# Patient Record
Sex: Female | Born: 1975 | Race: Black or African American | Hispanic: No | Marital: Single | Smoking: Never smoker
Health system: Southern US, Community
[De-identification: ages and names within clinical notes are randomized; demographics above are authoritative.]

## PROBLEM LIST (undated history)

## (undated) DIAGNOSIS — F419 Anxiety disorder, unspecified: Secondary | ICD-10-CM

## (undated) DIAGNOSIS — I10 Essential (primary) hypertension: Secondary | ICD-10-CM

---

## 2014-01-09 ENCOUNTER — Ambulatory Visit: Payer: Self-pay | Admitting: Gynecology

## 2014-01-20 ENCOUNTER — Ambulatory Visit (INDEPENDENT_AMBULATORY_CARE_PROVIDER_SITE_OTHER): Payer: BC Managed Care – PPO | Admitting: Gynecology

## 2014-01-20 ENCOUNTER — Other Ambulatory Visit (HOSPITAL_COMMUNITY)
Admission: RE | Admit: 2014-01-20 | Discharge: 2014-01-20 | Disposition: A | Discharge status: Home / Self Care | Payer: BC Managed Care – PPO | Source: Ambulatory Visit | Attending: Gynecology | Admitting: Gynecology

## 2014-01-20 ENCOUNTER — Encounter: Payer: Self-pay | Admitting: Gynecology

## 2014-01-20 VITALS — BP 112/72 | Ht 64.75 in | Wt 187.0 lb

## 2014-01-20 DIAGNOSIS — Z01419 Encounter for gynecological examination (general) (routine) without abnormal findings: Secondary | ICD-10-CM | POA: Diagnosis not present

## 2014-01-20 DIAGNOSIS — Z30018 Encounter for initial prescription of other contraceptives: Secondary | ICD-10-CM

## 2014-01-20 DIAGNOSIS — Z1151 Encounter for screening for human papillomavirus (HPV): Secondary | ICD-10-CM | POA: Diagnosis present

## 2014-01-20 DIAGNOSIS — R635 Abnormal weight gain: Secondary | ICD-10-CM

## 2014-01-20 DIAGNOSIS — Z3009 Encounter for other general counseling and advice on contraception: Secondary | ICD-10-CM

## 2014-01-20 LAB — CBC WITH DIFFERENTIAL/PLATELET
BASOS ABS: 0 10*3/uL (ref 0.0–0.1)
BASOS PCT: 0 % (ref 0–1)
EOS ABS: 0.1 10*3/uL (ref 0.0–0.7)
EOS PCT: 1 % (ref 0–5)
HCT: 39.2 % (ref 36.0–46.0)
Hemoglobin: 13.2 g/dL (ref 12.0–15.0)
LYMPHS PCT: 37 % (ref 12–46)
Lymphs Abs: 2.2 10*3/uL (ref 0.7–4.0)
MCH: 29.5 pg (ref 26.0–34.0)
MCHC: 33.7 g/dL (ref 30.0–36.0)
MCV: 87.5 fL (ref 78.0–100.0)
MONO ABS: 0.5 10*3/uL (ref 0.1–1.0)
Monocytes Relative: 9 % (ref 3–12)
Neutro Abs: 3.2 10*3/uL (ref 1.7–7.7)
Neutrophils Relative %: 53 % (ref 43–77)
PLATELETS: 291 10*3/uL (ref 150–400)
RBC: 4.48 MIL/uL (ref 3.87–5.11)
RDW: 14.2 % (ref 11.5–15.5)
WBC: 6 10*3/uL (ref 4.0–10.5)

## 2014-01-20 LAB — LIPID PANEL
CHOL/HDL RATIO: 2.9 ratio
Cholesterol: 148 mg/dL (ref 0–200)
HDL: 51 mg/dL (ref >39)
LDL Cholesterol: 90 mg/dL (ref 0–99)
Triglycerides: 35 mg/dL (ref <150)
VLDL: 7 mg/dL (ref 0–40)

## 2014-01-20 LAB — COMPREHENSIVE METABOLIC PANEL
ALK PHOS: 61 U/L (ref 39–117)
ALT: 9 U/L (ref 0–35)
AST: 11 U/L (ref 0–37)
Albumin: 4.5 g/dL (ref 3.5–5.2)
BUN: 13 mg/dL (ref 6–23)
CALCIUM: 8.9 mg/dL (ref 8.4–10.5)
CHLORIDE: 103 meq/L (ref 96–112)
CO2: 25 mEq/L (ref 19–32)
CREATININE: 0.77 mg/dL (ref 0.50–1.10)
Glucose, Bld: 96 mg/dL (ref 70–99)
Potassium: 4.3 mEq/L (ref 3.5–5.3)
Sodium: 135 mEq/L (ref 135–145)
Total Bilirubin: 0.3 mg/dL (ref 0.2–1.2)
Total Protein: 6.6 g/dL (ref 6.0–8.3)

## 2014-01-20 LAB — TSH: TSH: 2.111 u[IU]/mL (ref 0.350–4.500)

## 2014-01-20 NOTE — Patient Instructions (Signed)
Levonorgestrel intrauterine device (IUD) What is this medicine? LEVONORGESTREL IUD (LEE voe nor jes trel) is a contraceptive (birth control) device. The device is placed inside the uterus by a healthcare professional. It is used to prevent pregnancy and can also be used to treat heavy bleeding that occurs during your period. Depending on the device, it can be used for 3 to 5 years. This medicine may be used for other purposes; ask your health care provider or pharmacist if you have questions. COMMON BRAND NAME(S): LILETTA, Mirena, Skyla What should I tell my health care provider before I take this medicine? They need to know if you have any of these conditions: -abnormal Pap smear -cancer of the breast, uterus, or cervix -diabetes -endometritis -genital or pelvic infection now or in the past -have more than one sexual partner or your partner has more than one partner -heart disease -history of an ectopic or tubal pregnancy -immune system problems -IUD in place -liver disease or tumor -problems with blood clots or take blood-thinners -use intravenous drugs -uterus of unusual shape -vaginal bleeding that has not been explained -an unusual or allergic reaction to levonorgestrel, other hormones, silicone, or polyethylene, medicines, foods, dyes, or preservatives -pregnant or trying to get pregnant -breast-feeding How should I use this medicine? This device is placed inside the uterus by a health care professional. Talk to your pediatrician regarding the use of this medicine in children. Special care may be needed. Overdosage: If you think you have taken too much of this medicine contact a poison control center or emergency room at once. NOTE: This medicine is only for you. Do not share this medicine with others. What if I miss a dose? This does not apply. What may interact with this medicine? Do not take this medicine with any of the following  medications: -amprenavir -bosentan -fosamprenavir This medicine may also interact with the following medications: -aprepitant -barbiturate medicines for inducing sleep or treating seizures -bexarotene -griseofulvin -medicines to treat seizures like carbamazepine, ethotoin, felbamate, oxcarbazepine, phenytoin, topiramate -modafinil -pioglitazone -rifabutin -rifampin -rifapentine -some medicines to treat HIV infection like atazanavir, indinavir, lopinavir, nelfinavir, tipranavir, ritonavir -St. John's wort -warfarin This list may not describe all possible interactions. Give your health care provider a list of all the medicines, herbs, non-prescription drugs, or dietary supplements you use. Also tell them if you smoke, drink alcohol, or use illegal drugs. Some items may interact with your medicine. What should I watch for while using this medicine? Visit your doctor or health care professional for regular check ups. See your doctor if you or your partner has sexual contact with others, becomes HIV positive, or gets a sexual transmitted disease. This product does not protect you against HIV infection (AIDS) or other sexually transmitted diseases. You can check the placement of the IUD yourself by reaching up to the top of your vagina with clean fingers to feel the threads. Do not pull on the threads. It is a good habit to check placement after each menstrual period. Call your doctor right away if you feel more of the IUD than just the threads or if you cannot feel the threads at all. The IUD may come out by itself. You may become pregnant if the device comes out. If you notice that the IUD has come out use a backup birth control method like condoms and call your health care provider. Using tampons will not change the position of the IUD and are okay to use during your period. What side effects may   I notice from receiving this medicine? Side effects that you should report to your doctor or  health care professional as soon as possible: -allergic reactions like skin rash, itching or hives, swelling of the face, lips, or tongue -fever, flu-like symptoms -genital sores -high blood pressure -no menstrual period for 6 weeks during use -pain, swelling, warmth in the leg -pelvic pain or tenderness -severe or sudden headache -signs of pregnancy -stomach cramping -sudden shortness of breath -trouble with balance, talking, or walking -unusual vaginal bleeding, discharge -yellowing of the eyes or skin Side effects that usually do not require medical attention (report to your doctor or health care professional if they continue or are bothersome): -acne -breast pain -change in sex drive or performance -changes in weight -cramping, dizziness, or faintness while the device is being inserted -headache -irregular menstrual bleeding within first 3 to 6 months of use -nausea This list may not describe all possible side effects. Call your doctor for medical advice about side effects. You may report side effects to FDA at 1-800-FDA-1088. Where should I keep my medicine? This does not apply. NOTE: This sheet is a summary. It may not cover all possible information. If you have questions about this medicine, talk to your doctor, pharmacist, or health care provider.  2015, Elsevier/Gold Standard. (2011-06-22 13:54:04)  

## 2014-01-20 NOTE — Progress Notes (Signed)
Ashley Steele 1975/08/17 [id number]   History:    38 y.o.  for annual gyn exam who is a new patient to the practice. Patient has moved from [place], [place].. Patient reports no previous history of any abnormal Pap smear. She reports a normal Pap smear in 2014. She is a gravida 2 para 1 AB 1 (one elective abortion). She's currently using condoms for contraception. She reports normal 5 day menstrual cycles. In the past she had used oral contraceptive pills but compliance became an issue and would like to discuss other forms of contraception. Patient denies any past history of any STDs.  Past medical history,surgical history, family history and social history were all reviewed and documented in the EPIC chart.  Gynecologic History Patient's last menstrual period was 01/02/2014. Contraception: condoms Last Pap: 2014. Results were: normal Last mammogram: Not indicated. Results were: Not indicated  Obstetric History OB History  Gravida Para Term Preterm AB SAB TAB Ectopic Multiple Living  2 1   1  1   1     # Outcome Date GA Lbr Len/2nd Weight Sex Delivery Anes PTL Lv  2 TAB           1 PAR                ROS: A ROS was performed and pertinent positives and negatives are included in the history.  GENERAL: No fevers or chills. HEENT: No change in vision, no earache, sore throat or sinus congestion. NECK: No pain or stiffness. CARDIOVASCULAR: No chest pain or pressure. No palpitations. PULMONARY: No shortness of breath, cough or wheeze. GASTROINTESTINAL: No abdominal pain, nausea, vomiting or diarrhea, melena or bright red blood per rectum. GENITOURINARY: No urinary frequency, urgency, hesitancy or dysuria. MUSCULOSKELETAL: No joint or muscle pain, no back pain, no recent trauma. DERMATOLOGIC: No rash, no itching, no lesions. ENDOCRINE: No polyuria, polydipsia, no heat or cold intolerance. No recent change in weight. HEMATOLOGICAL: No anemia or easy bruising or bleeding. NEUROLOGIC: No headache,  seizures, numbness, tingling or weakness. PSYCHIATRIC: No depression, no loss of interest in normal activity or change in sleep pattern.     Exam: chaperone present  BP 112/72  Ht 5' 4.75" (1.645 m)  Wt 187 lb (84.823 kg)  BMI 31.35 kg/m2  LMP 01/02/2014  Body mass index is 31.35 kg/(m^2).  General appearance : Well developed well nourished female. No acute distress HEENT: Neck supple, trachea midline, no carotid bruits, no thyroidmegaly Lungs: Clear to auscultation, no rhonchi or wheezes, or rib retractions  Heart: Regular rate and rhythm, no murmurs or gallops Breast:Examined in sitting and supine position were symmetrical in appearance, no palpable masses or tenderness,  no skin retraction, no nipple inversion, no nipple discharge, no skin discoloration, no axillary or supraclavicular lymphadenopathy Abdomen: no palpable masses or tenderness, no rebound or guarding Extremities: no edema or skin discoloration or tenderness  Pelvic:  Bartholin, Urethra, Skene Glands: Within normal limits             Vagina: No gross lesions or discharge  Cervix: No gross lesions or discharge  Uterus  anteverted, normal size, shape and consistency, non-tender and mobile  Adnexa  Without masses or tenderness  Anus and perineum  normal   Rectovaginal  normal sphincter tone without palpated masses or tenderness             Hemoccult not indicated     Assessment/Plan:  38 y.o. female for annual exam who is new to the practice.  Since we do not have her previous Pap smear we will do a Pap smear today didn't adhere to the new guidelines. The following labs were ordered today: CBC, comprehensive metabolic panel, fasting lipid profile, TSH, and urinalysis. Literature information on the Mirena IUD was provided as well as on the [place] IUD. Patient will decide and make an appointment accordingly. We discussed her for some monthly self breast examinations.  Note: This dictation was prepared with   Dragon/digital dictation along withSmart phrase technology. Any transcriptional errors that result from this process are unintentional.   Terrance Mass MD, 8:58 AM 01/20/2014

## 2014-01-20 NOTE — Addendum Note (Signed)
Addended by: Alen Blew on: 01/20/2014 04:28 PM   Modules accepted: Orders

## 2014-01-21 LAB — URINALYSIS W MICROSCOPIC + REFLEX CULTURE
BACTERIA UA: NONE SEEN
Bilirubin Urine: NEGATIVE
CASTS: NONE SEEN
Crystals: NONE SEEN
Glucose, UA: NEGATIVE mg/dL
HGB URINE DIPSTICK: NEGATIVE
KETONES UR: NEGATIVE mg/dL
Leukocytes, UA: NEGATIVE
NITRITE: NEGATIVE
PH: 7 (ref 5.0–8.0)
PROTEIN: NEGATIVE mg/dL
Specific Gravity, Urine: 1.021 (ref 1.005–1.030)
Squamous Epithelial / LPF: NONE SEEN
Urobilinogen, UA: 1 mg/dL (ref 0.0–1.0)

## 2014-01-22 ENCOUNTER — Telehealth: Payer: Self-pay | Admitting: Gynecology

## 2014-01-22 LAB — CYTOLOGY - PAP

## 2014-01-22 NOTE — Telephone Encounter (Signed)
01/22/14-Pt was advised today that her [facility] insurance covers either the Mirena or Paraguard IUD at 100% since she is using it for contraception. Pt to advise if she wishes to proceed.wl

## 2014-01-23 ENCOUNTER — Telehealth: Payer: Self-pay | Admitting: *Deleted

## 2014-01-23 MED ORDER — NORETHINDRONE ACET-ETHINYL EST 1-20 MG-MCG PO TABS: 1.0000 | ORAL_TABLET | Freq: Every day | ORAL | Status: DC

## 2014-01-23 NOTE — Telephone Encounter (Signed)
Please call in prescription for Junel 1/20. 1 PKG with 11 refills. Have her start it the second day she is bleeding on her cycle.

## 2014-01-23 NOTE — Telephone Encounter (Signed)
Pt informed with the below note, rx sent. 

## 2014-01-23 NOTE — Telephone Encounter (Signed)
Pt calling to follow up from [place] on 01/20/14 she does not want to proceed with Mirena or paraguard she would like Rx for BCP. Please advise

## 2014-03-30 ENCOUNTER — Encounter: Payer: BC Managed Care – PPO | Admitting: Gynecology

## 2014-04-07 ENCOUNTER — Encounter: Payer: Self-pay | Admitting: Gynecology

## 2014-07-30 ENCOUNTER — Ambulatory Visit: Payer: BLUE CROSS/BLUE SHIELD | Attending: Family Medicine

## 2014-07-30 DIAGNOSIS — M5440 Lumbago with sciatica, unspecified side: Secondary | ICD-10-CM | POA: Diagnosis present

## 2014-07-30 DIAGNOSIS — R29898 Other symptoms and signs involving the musculoskeletal system: Secondary | ICD-10-CM | POA: Insufficient documentation

## 2014-07-30 DIAGNOSIS — M25659 Stiffness of unspecified hip, not elsewhere classified: Secondary | ICD-10-CM | POA: Diagnosis not present

## 2014-07-30 DIAGNOSIS — M5442 Lumbago with sciatica, left side: Secondary | ICD-10-CM

## 2014-07-30 DIAGNOSIS — M5441 Lumbago with sciatica, right side: Secondary | ICD-10-CM

## 2014-07-30 NOTE — Patient Instructions (Signed)
Trunk: Rotation   Lie on back on firm, flat surface with knees bent. Keep head and shoulders flat, slowly lower knees to floor. May also do with legs straight. Lift one at a time up and across to touch floor. Hold ___20_ seconds. Repeat __3__ times, alternating sides. Do _3___ sessions per day. CAUTION: Movement should be gentle, steady and slow.  Copyright  VHI. All rights reserved.  Knee to Chest   Lying supine, bend involved knee to chest. Hold 20 seconds and perform _3__ times. Repeat with other leg. Do __3_ times per day.  Copyright  VHI. All rights reserved.  Double Knee to Chest (Flexion)   Gently pull both knees toward chest. Feel stretch in lower back or buttock area. Breathing deeply, Hold _20___ seconds. Repeat _3___ times. Do _3___ sessions per day.  http://gt2.exer.us/228   Copyright  VHI. All rights reserved.

## 2014-07-30 NOTE — Therapy (Addendum)
[facility] [address] [place], [place], [address] Phone: [phone]   Fax:  [phone]  Physical Therapy Evaluation  Patient Details  Name: Ashley Steele MRN: [id number] Date of Birth: 1975/11/13 Referring Provider:  Lujean Amel, MD  Encounter Date: 07/30/2014      PT End of Session - 07/30/14 1053    Visit Number 1   Date for PT Re-Evaluation 09/17/14   PT Start Time 1017   PT Stop Time 1110   PT Time Calculation (min) 53 min   Activity Tolerance Patient tolerated treatment well   Behavior During Therapy [facility] for tasks assessed/performed      History reviewed. No pertinent past medical history.  History reviewed. No pertinent past surgical history.  There were no vitals taken for this visit.  Visit Diagnosis:  Bilateral low back pain with sciatica, sciatica laterality unspecified - Plan: PT plan of care cert/re-cert  Weakness of right hip - Plan: PT plan of care cert/re-cert  Stiffness of hip joint, unspecified laterality - Plan: PT plan of care cert/re-cert      Subjective Assessment - 07/30/14 1021    Symptoms Pt presents to PT with complaints of LBP that is now radiating into the LEs (Rt>Lt) with progressive LE.    Pertinent History No incident or injury   Limitations Sitting;House hold activities   How long can you sit comfortably? 1 hour max   Diagnostic tests none   Patient Stated Goals Reduce pain   Currently in Pain? Yes   Pain Score 8    Pain Location Back   Pain Orientation Right;Left;Lower   Pain Descriptors / Indicators Stabbing;Burning;Shooting   Pain Type Chronic pain   Pain Radiating Towards both legs- shooting to the ankle   Pain Onset Other (comment)  6 month history   Aggravating Factors  sitting, housework   Pain Relieving Factors medication, not sitting   Multiple Pain Sites No          [facility] PT Assessment - 07/30/14 0001    Assessment   Medical Diagnosis LPB  radiating to Rt LE (M54.5)   Onset Date 01/03/14   Precautions   Precautions None   Balance Screen   Has the patient fallen in the past 6 months No   Has the patient had a decrease in activity level because of a fear of falling?  No   Is the patient reluctant to leave their home because of a fear of falling?  No   Home Environment   Living Enviornment Private residence   Home Access Stairs to enter  2nd story apartment   Prior Function   Level of Independence Independent with basic ADLs   Vocation Full time employment   Tourist information centre manager: sitting, desk   Leisure Pt walks for exercise and not performing since 01/2014   Cognition   Overall Cognitive Status Within Functional Limits for tasks assessed   Observation/Other Assessments   Focus on Therapeutic Outcomes (FOTO)  53% limitation   Posture/Postural Control   Posture/Postural Control No significant limitations   ROM / Strength   AROM / PROM / Strength AROM;PROM;Strength   AROM   Overall AROM Comments Lumbar AROM is full with no pain reported by pt   AROM Assessment Site Lumbar;Hip   PROM   Overall PROM Comments Bilateral hip flexibility limited by 25-50% in all directions without pain   PROM Assessment Site Hip   Right/Left Hip Right;Left   Left Hip Flexion --  Bilateral hamstrings limited by 56-60%   Strength   Overall Strength Deficits   Strength Assessment Site Hip;Knee   Right/Left Hip Right;Left   Right Hip Flexion 4-/5   Left Hip Flexion 4/5   Right/Left Knee Right;Left   Right Knee Flexion 4+/5   Right Knee Extension 4+/5   Left Knee Flexion 4+/5   Left Knee Extension 4+/5   Palpation   Palpation Pt with muscle tension noted over bilateral lumbar paraspinals wiht increased tone.  Reduced PA mobility with mobs in the lumbar spine without pain.  Tender to palpation over Rt SI joint and deep gluteals   Special Tests    Special Tests Lumbar   Slump test   Findings Negative   Side  Right   Straight Leg Raise   Findings Negative   Side  Right   Ambulation/Gait   Ambulation/Gait Yes   Ambulation/Gait Assistance 7: Independent                  [facility] Adult PT Treatment/Exercise - 07/30/14 0001    Exercises   Exercises Lumbar;Knee/Hip   Lumbar Exercises: Stretches   Single Knee to Chest Stretch 3 reps;20 seconds   Double Knee to Chest Stretch 3 reps;20 seconds   Lower Trunk Rotation 3 reps;20 seconds   Modalities   Modalities Electrical Stimulation;Moist Heat   Moist Heat Therapy   Number Minutes Moist Heat 15 Minutes   Moist Heat Location Other (comment)  lumbar   Electrical Stimulation   Electrical Stimulation Location lumbar   Electrical Stimulation Action interferential   Electrical Stimulation Parameters 15 min   Electrical Stimulation Goals Pain                PT Education - 07/30/14 1053    Education provided Yes   Education Details HEP   Person(s) Educated Patient   Methods Explanation;Demonstration;Tactile cues;Handout   Comprehension Verbalized understanding;Returned demonstration          PT Short Term Goals - 07/30/14 1058    PT SHORT TERM GOAL #1   Title be independent in initial HEP   Time 4   Period Weeks   PT SHORT TERM GOAL #2   Title report < or = to 6/10 LBP with sitting at work   Time 4   Period Weeks   PT SHORT TERM GOAL #3   Title initiate return to regular walking program and verbalize how to safely progress   Time 4   Period Weeks   Status New           PT Long Term Goals - 07/30/14 1016    PT LONG TERM GOAL #1   Title be independent in advanced HEP   Time 8   Period Weeks   Status New   PT LONG TERM GOAL #2   Title reduce FOTO to < or = to 38% limitation   Time 8   Period Weeks   Status New   PT LONG TERM GOAL #3   Title return to regular walking routine without limitation   Time 8   Period Weeks   Status New   PT LONG TERM GOAL #4   Title sit at work with < or = to 4/10 LBP    Time 8   Period Weeks   Status New   PT LONG TERM GOAL #5   Title report a 70% reduction in LE radiuclopathy   Time 8   Period Weeks   Status New  Additional Long Term Goals   Additional Long Term Goals Yes   PT LONG TERM GOAL #6   Title demonstrate 5/5 Rt hip flexor strength to improve safety and endurance   Time 8   Period Weeks   Status New               Plan - 07/30/14 1053    Clinical Impression Statement Pt is a 39 y.o. female with 6 month history LBP, Rt>Lt LE weakness and radiculopathy.  Limitation in hip and lumbar flexibility and pain with sitting.   Pt will benefit from skilled therapeutic intervention in order to improve on the following deficits Decreased range of motion;Impaired flexibility;Improper body mechanics;Pain;Decreased strength         Problem List There are no active problems to display for this patient.   Kinney Sackmann, PT 07/30/2014, 11:09 AM PHYSICAL THERAPY DISCHARGE SUMMARY  Visits from Start of Care: 1 Current functional level related to goals / functional outcomes: Pt attended 1 PT session for evaluation and didn't return.  Pt wanted to try medication and requested D/C from PT.     Remaining deficits: See above evaluation.    Education / Equipment: HEP Plan: Patient agrees to discharge.  Patient goals were not met. Patient is being discharged due to the patient's request.  ?????    Sigurd Sos, PT 09/02/2014 9:07 AM   [facility] [address] [place], [place], [address] Phone: [phone]   Fax:  [phone]

## 2014-08-06 ENCOUNTER — Other Ambulatory Visit: Payer: Self-pay | Admitting: Family Medicine

## 2014-08-06 DIAGNOSIS — M545 Low back pain: Secondary | ICD-10-CM

## 2014-08-06 DIAGNOSIS — M79604 Pain in right leg: Secondary | ICD-10-CM

## 2014-08-11 ENCOUNTER — Telehealth: Payer: Self-pay

## 2014-08-11 ENCOUNTER — Ambulatory Visit
Admission: RE | Admit: 2014-08-11 | Discharge: 2014-08-11 | Disposition: A | Discharge status: Home / Self Care | Payer: BLUE CROSS/BLUE SHIELD | Source: Ambulatory Visit | Attending: Family Medicine | Admitting: Family Medicine

## 2014-08-11 DIAGNOSIS — M545 Low back pain: Secondary | ICD-10-CM

## 2014-08-11 DIAGNOSIS — M79604 Pain in right leg: Secondary | ICD-10-CM

## 2014-08-11 NOTE — Telephone Encounter (Signed)
Pt cancel appt, she having a MRI done and wants to know the results before continuing rehab.

## 2014-08-12 ENCOUNTER — Ambulatory Visit: Payer: BLUE CROSS/BLUE SHIELD | Admitting: Physical Therapy

## 2014-08-14 ENCOUNTER — Ambulatory Visit: Payer: BLUE CROSS/BLUE SHIELD | Attending: Family Medicine | Admitting: Physical Therapy

## 2014-08-14 ENCOUNTER — Telehealth: Payer: Self-pay | Admitting: Physical Therapy

## 2014-08-14 DIAGNOSIS — R29898 Other symptoms and signs involving the musculoskeletal system: Secondary | ICD-10-CM | POA: Insufficient documentation

## 2014-08-14 DIAGNOSIS — M5440 Lumbago with sciatica, unspecified side: Secondary | ICD-10-CM | POA: Insufficient documentation

## 2014-08-14 DIAGNOSIS — M25659 Stiffness of unspecified hip, not elsewhere classified: Secondary | ICD-10-CM | POA: Insufficient documentation

## 2014-08-19 ENCOUNTER — Ambulatory Visit: Payer: BLUE CROSS/BLUE SHIELD | Admitting: Physical Therapy

## 2014-08-21 ENCOUNTER — Encounter: Payer: BLUE CROSS/BLUE SHIELD | Admitting: Physical Therapy

## 2014-08-24 ENCOUNTER — Encounter: Payer: BLUE CROSS/BLUE SHIELD | Admitting: Physical Therapy

## 2014-08-26 ENCOUNTER — Encounter: Payer: BLUE CROSS/BLUE SHIELD | Admitting: Physical Therapy

## 2014-09-02 ENCOUNTER — Ambulatory Visit: Payer: BLUE CROSS/BLUE SHIELD | Admitting: Physical Therapy

## 2014-09-04 ENCOUNTER — Ambulatory Visit: Payer: BLUE CROSS/BLUE SHIELD | Admitting: Physical Therapy

## 2014-09-09 ENCOUNTER — Encounter: Payer: BLUE CROSS/BLUE SHIELD | Admitting: Physical Therapy

## 2014-09-11 ENCOUNTER — Encounter: Payer: BLUE CROSS/BLUE SHIELD | Admitting: Physical Therapy

## 2014-12-21 ENCOUNTER — Other Ambulatory Visit: Payer: Self-pay

## 2014-12-21 MED ORDER — NORETHINDRONE ACET-ETHINYL EST 1-20 MG-MCG PO TABS: 1.0000 | ORAL_TABLET | Freq: Every day | ORAL | Status: DC

## 2014-12-21 NOTE — Telephone Encounter (Signed)
Patient has Ce scheduled 01/22/15 but Oc's run out this coming weekend. Needs a refill until visit. Rx sent.

## 2014-12-29 ENCOUNTER — Other Ambulatory Visit: Payer: Self-pay | Admitting: Gynecology

## 2015-01-22 ENCOUNTER — Ambulatory Visit (INDEPENDENT_AMBULATORY_CARE_PROVIDER_SITE_OTHER): Payer: BLUE CROSS/BLUE SHIELD | Admitting: Gynecology

## 2015-01-22 ENCOUNTER — Encounter: Payer: Self-pay | Admitting: Gynecology

## 2015-01-22 VITALS — BP 122/80 | Ht 64.0 in | Wt 208.0 lb

## 2015-01-22 DIAGNOSIS — N760 Acute vaginitis: Secondary | ICD-10-CM | POA: Diagnosis not present

## 2015-01-22 DIAGNOSIS — Z01419 Encounter for gynecological examination (general) (routine) without abnormal findings: Secondary | ICD-10-CM

## 2015-01-22 DIAGNOSIS — N898 Other specified noninflammatory disorders of vagina: Secondary | ICD-10-CM | POA: Diagnosis not present

## 2015-01-22 DIAGNOSIS — B9689 Other specified bacterial agents as the cause of diseases classified elsewhere: Secondary | ICD-10-CM

## 2015-01-22 DIAGNOSIS — A499 Bacterial infection, unspecified: Secondary | ICD-10-CM | POA: Diagnosis not present

## 2015-01-22 LAB — WET PREP FOR TRICH, YEAST, CLUE
Clue Cells Wet Prep HPF POC: NONE SEEN
Trich, Wet Prep: NONE SEEN
Yeast Wet Prep HPF POC: NONE SEEN

## 2015-01-22 MED ORDER — NORETHINDRONE ACET-ETHINYL EST 1-20 MG-MCG PO TABS: 1.0000 | ORAL_TABLET | Freq: Every day | ORAL | Status: DC

## 2015-01-22 MED ORDER — TINIDAZOLE 500 MG PO TABS: ORAL_TABLET | ORAL | Status: DC

## 2015-01-22 NOTE — Patient Instructions (Signed)
Tinidazole tablets What is this medicine? TINIDAZOLE (tye NI da zole) is an antiinfective. It is used to treat amebiasis, giardiasis, trichomoniasis, and vaginosis. It will not work for colds, flu, or other viral infections. This medicine may be used for other purposes; ask your health care provider or pharmacist if you have questions. COMMON BRAND NAME(S): Tindamax What should I tell my health care provider before I take this medicine? They need to know if you have any of these conditions: -anemia or other blood disorders -if you frequently drink alcohol containing drinks -receiving hemodialysis -seizure disorder -an unusual or allergic reaction to tinidazole, other medicines, foods, dyes, or preservatives -pregnant or trying to get pregnant -breast-feeding How should I use this medicine? Take this medicine by mouth with a full glass of water. Follow the directions on the prescription label. Take with food. Take your medicine at regular intervals. Do not take your medicine more often than directed. Take all of your medicine as directed even if you think you are better. Do not skip doses or stop your medicine early. Talk to your pediatrician regarding the use of this medicine in children. While this drug may be prescribed for children as young as 3 years of age for selected conditions, precautions do apply. Overdosage: If you think you have taken too much of this medicine contact a poison control center or emergency room at once. NOTE: This medicine is only for you. Do not share this medicine with others. What if I miss a dose? If you miss a dose, take it as soon as you can. If it is almost time for your next dose, take only that dose. Do not take double or extra doses. What may interact with this medicine? Do not take this medicine with any of the following medications: -alcohol or any product that contains alcohol -amprenavir oral solution -disulfiram -paclitaxel injection -ritonavir  oral solution -sertraline oral solution -sulfamethoxazole-trimethoprim injection This medicine may also interact with the following medications: -cholestyramine -cimetidine -conivaptan -cyclosporin -fluorouracil -fosphenytoin, phenytoin -ketoconazole -lithium -phenobarbital -tacrolimus -warfarin This list may not describe all possible interactions. Give your health care provider a list of all the medicines, herbs, non-prescription drugs, or dietary supplements you use. Also tell them if you smoke, drink alcohol, or use illegal drugs. Some items may interact with your medicine. What should I watch for while using this medicine? Tell your doctor or health care professional if your symptoms do not improve or if they get worse. Avoid alcoholic drinks while you are taking this medicine and for three days afterward. Alcohol may make you feel dizzy, sick, or flushed. If you are being treated for a sexually transmitted disease, avoid sexual contact until you have finished your treatment. Your sexual partner may also need treatment. What side effects may I notice from receiving this medicine? Side effects that you should report to your doctor or health care professional as soon as possible: -allergic reactions like skin rash, itching or hives, swelling of the face, lips, or tongue -breathing problems -confusion, depression -dark or white patches in the mouth -feeling faint or lightheaded, falls -fever, infection -numbness, tingling, pain or weakness in the hands or feet -pain when passing urine -seizures -unusually weak or tired -vaginal irritation or discharge -vomiting Side effects that usually do not require medical attention (report to your doctor or health care professional if they continue or are bothersome): -dark brown or reddish urine -diarrhea -headache -loss of appetite -metallic taste -nausea -stomach upset This list may not describe all  possible side effects. Call your  doctor for medical advice about side effects. You may report side effects to FDA at 1-800-FDA-1088. Where should I keep my medicine? Keep out of the reach of children. Store at room temperature between 15 and 30 degrees C (59 and 86 degrees F). Protect from light and moisture. Keep container tightly closed. Throw away any unused medicine after the expiration date. NOTE: This sheet is a summary. It may not cover all possible information. If you have questions about this medicine, talk to your doctor, pharmacist, or health care provider.  2015, Elsevier/Gold Standard. (2008-02-17 15:22:28) Bacterial Vaginosis Bacterial vaginosis is a vaginal infection that occurs when the normal balance of bacteria in the vagina is disrupted. It results from an overgrowth of certain bacteria. This is the most common vaginal infection in women of childbearing age. Treatment is important to prevent complications, especially in pregnant women, as it can cause a premature delivery. CAUSES  Bacterial vaginosis is caused by an increase in harmful bacteria that are normally present in smaller amounts in the vagina. Several different kinds of bacteria can cause bacterial vaginosis. However, the reason that the condition develops is not fully understood. RISK FACTORS Certain activities or behaviors can put you at an increased risk of developing bacterial vaginosis, including:  Having a new sex partner or multiple sex partners.  Douching.  Using an intrauterine device (IUD) for contraception. Women do not get bacterial vaginosis from toilet seats, bedding, swimming pools, or contact with objects around them. SIGNS AND SYMPTOMS  Some women with bacterial vaginosis have no signs or symptoms. Common symptoms include:  Grey vaginal discharge.  A fishlike odor with discharge, especially after sexual intercourse.  Itching or burning of the vagina and vulva.  Burning or pain with urination. DIAGNOSIS  Your health care  provider will take a medical history and examine the vagina for signs of bacterial vaginosis. A sample of vaginal fluid may be taken. Your health care provider will look at this sample under a microscope to check for bacteria and abnormal cells. A vaginal pH test may also be done.  TREATMENT  Bacterial vaginosis may be treated with antibiotic medicines. These may be given in the form of a pill or a vaginal cream. A second round of antibiotics may be prescribed if the condition comes back after treatment.  HOME CARE INSTRUCTIONS   Only take over-the-counter or prescription medicines as directed by your health care provider.  If antibiotic medicine was prescribed, take it as directed. Make sure you finish it even if you start to feel better.  Do not have sex until treatment is completed.  Tell all sexual partners that you have a vaginal infection. They should see their health care provider and be treated if they have problems, such as a mild rash or itching.  Practice safe sex by using condoms and only having one sex partner. SEEK MEDICAL CARE IF:   Your symptoms are not improving after 3 days of treatment.  You have increased discharge or pain.  You have a fever. MAKE SURE YOU:   Understand these instructions.  Will watch your condition.  Will get help right away if you are not doing well or get worse. FOR MORE INFORMATION  Centers for Disease Control and Prevention, Division of STD Prevention: [employer]raiser[employer]raud[employer] American Sexual Health Association (ASHA): www.ashastd.org  Document Released: 05/22/2005 Document Revised: 03/12/2013 Document Reviewed: 01/01/2013 [facility] Patient Information 2015 [place], [place]. This information is not intended to replace advice given to you  by your health care provider. Make sure you discuss any questions you have with your health care provider.

## 2015-01-22 NOTE — Progress Notes (Signed)
Ashley Steele [birth year] [id number]   History:    39 y.o.  for annual gyn exam with a complaint of a slight vaginal discharge for a few days. She is sexually active. She is in a monogamous relationship. She is doing well on her oral contraception pills having regular light menstrual cycles. Patient's Tdap vaccine is up-to-date. Patient currently being evaluated and treated by orthopedics for back and lower extremity discomfort and pain. Patient denies any past history of any STDs. No prior history of any abnormal Pap smears.  Past medical history,surgical history, family history and social history were all reviewed and documented in the EPIC chart.  Gynecologic History Patient's last menstrual period was 12/29/2014. Contraception: OCP (estrogen/progesterone) Last Pap: 2015. Results were: normal Last mammogram: Not indicated. Results were: Not indicated  Obstetric History OB History  Gravida Para Term Preterm AB SAB TAB Ectopic Multiple Living  2 1   1  1   1     # Outcome Date GA Lbr Len/2nd Weight Sex Delivery Anes PTL Lv  2 TAB           1 Para                ROS: A ROS was performed and pertinent positives and negatives are included in the history.  GENERAL: No fevers or chills. HEENT: No change in vision, no earache, sore throat or sinus congestion. NECK: No pain or stiffness. CARDIOVASCULAR: No chest pain or pressure. No palpitations. PULMONARY: No shortness of breath, cough or wheeze. GASTROINTESTINAL: No abdominal pain, nausea, vomiting or diarrhea, melena or bright red blood per rectum. GENITOURINARY: No urinary frequency, urgency, hesitancy or dysuria. MUSCULOSKELETAL: No joint or muscle pain, no back pain, no recent trauma. DERMATOLOGIC: No rash, no itching, no lesions. ENDOCRINE: No polyuria, polydipsia, no heat or cold intolerance. No recent change in weight. HEMATOLOGICAL: No anemia or easy bruising or bleeding. NEUROLOGIC: No headache, seizures, numbness, tingling or  weakness. PSYCHIATRIC: No depression, no loss of interest in normal activity or change in sleep pattern.     Exam: chaperone present  BP 122/80 mmHg  Ht 5\' 4"  (1.626 m)  Wt 208 lb (94.348 kg)  BMI 35.69 kg/m2  LMP 12/29/2014  Body mass index is 35.69 kg/(m^2).  General appearance : Well developed well nourished female. No acute distress HEENT: Eyes: no retinal hemorrhage or exudates,  Neck supple, trachea midline, no carotid bruits, no thyroidmegaly Lungs: Clear to auscultation, no rhonchi or wheezes, or rib retractions  Heart: Regular rate and rhythm, no murmurs or gallops Breast:Examined in sitting and supine position were symmetrical in appearance, no palpable masses or tenderness,  no skin retraction, no nipple inversion, no nipple discharge, no skin discoloration, no axillary or supraclavicular lymphadenopathy Abdomen: no palpable masses or tenderness, no rebound or guarding Extremities: no edema or skin discoloration or tenderness  Pelvic:  Bartholin, Urethra, Skene Glands: Within normal limits             Vagina: No gross lesions or discharge  Cervix: No gross lesions or discharge  Uterus  anteverted, normal size, shape and consistency, non-tender and mobile  Adnexa  Without masses or tenderness  Anus and perineum  normal   Rectovaginal  normal sphincter tone without palpated masses or tenderness             Hemoccult not indicated   Wet prep moderate WBC, moderate bacteria  Assessment/Plan:  39 y.o. female for annual exam with what appears to be a mild  form of bacterial vaginosis she'll be treated with Tindamax 500 mg 4 tablets today repeat in 24 hours. Pap smear not indicated according to the new guidelines at this time. Prescription refill for oral contraception pill was provided. Patient to schedule appointment next week for her screening blood work since she is not fasting today which will include the following: Fasting lipid profile, comprehensive metabolic panel, TSH,  CBC, and urinalysis.   Terrance Mass MD, 4:38 PM 01/22/2015

## 2015-01-23 LAB — URINALYSIS W MICROSCOPIC + REFLEX CULTURE
BILIRUBIN URINE: NEGATIVE
Bacteria, UA: NONE SEEN [HPF]
Casts: NONE SEEN [LPF]
Crystals: NONE SEEN [HPF]
Glucose, UA: NEGATIVE
Hgb urine dipstick: NEGATIVE
KETONES UR: NEGATIVE
LEUKOCYTES UA: NEGATIVE
NITRITE: NEGATIVE
PH: 7.5 (ref 5.0–8.0)
Protein, ur: NEGATIVE
RBC / HPF: NONE SEEN RBC/HPF (ref <=2)
SPECIFIC GRAVITY, URINE: 1.02 (ref 1.001–1.035)
Yeast: NONE SEEN [HPF]

## 2015-01-25 ENCOUNTER — Other Ambulatory Visit: Payer: Self-pay | Admitting: Gynecology

## 2015-01-25 MED ORDER — NITROFURANTOIN MONOHYD MACRO 100 MG PO CAPS: 100.0000 mg | ORAL_CAPSULE | Freq: Two times a day (BID) | ORAL | Status: DC

## 2015-01-26 ENCOUNTER — Other Ambulatory Visit: Payer: BLUE CROSS/BLUE SHIELD

## 2015-01-26 LAB — CBC WITH DIFFERENTIAL/PLATELET
BASOS PCT: 0 % (ref 0–1)
Basophils Absolute: 0 10*3/uL (ref 0.0–0.1)
Eosinophils Absolute: 0.1 10*3/uL (ref 0.0–0.7)
Eosinophils Relative: 1 % (ref 0–5)
HCT: 41 % (ref 36.0–46.0)
Hemoglobin: 13.6 g/dL (ref 12.0–15.0)
Lymphocytes Relative: 34 % (ref 12–46)
Lymphs Abs: 2.5 10*3/uL (ref 0.7–4.0)
MCH: 29.9 pg (ref 26.0–34.0)
MCHC: 33.2 g/dL (ref 30.0–36.0)
MCV: 90.1 fL (ref 78.0–100.0)
MONO ABS: 0.4 10*3/uL (ref 0.1–1.0)
MPV: 10 fL (ref 8.6–12.4)
Monocytes Relative: 6 % (ref 3–12)
NEUTROS ABS: 4.4 10*3/uL (ref 1.7–7.7)
NEUTROS PCT: 59 % (ref 43–77)
Platelets: 334 10*3/uL (ref 150–400)
RBC: 4.55 MIL/uL (ref 3.87–5.11)
RDW: 14.5 % (ref 11.5–15.5)
WBC: 7.4 10*3/uL (ref 4.0–10.5)

## 2015-01-26 LAB — LIPID PANEL
Cholesterol: 203 mg/dL — ABNORMAL HIGH (ref 125–200)
HDL: 48 mg/dL (ref >=46)
LDL CALC: 142 mg/dL — AB (ref <130)
Total CHOL/HDL Ratio: 4.2 Ratio (ref <=5.0)
Triglycerides: 67 mg/dL (ref <150)
VLDL: 13 mg/dL (ref <30)

## 2015-01-26 LAB — URINE CULTURE

## 2015-01-26 LAB — COMPREHENSIVE METABOLIC PANEL
ALT: 13 U/L (ref 6–29)
AST: 13 U/L (ref 10–30)
Albumin: 4.3 g/dL (ref 3.6–5.1)
Alkaline Phosphatase: 66 U/L (ref 33–115)
BUN: 14 mg/dL (ref 7–25)
CO2: 22 mmol/L (ref 20–31)
CREATININE: 0.79 mg/dL (ref 0.50–1.10)
Calcium: 9.3 mg/dL (ref 8.6–10.2)
Chloride: 106 mmol/L (ref 98–110)
Glucose, Bld: 92 mg/dL (ref 65–99)
Potassium: 4.3 mmol/L (ref 3.5–5.3)
SODIUM: 139 mmol/L (ref 135–146)
Total Bilirubin: 0.3 mg/dL (ref 0.2–1.2)
Total Protein: 7.1 g/dL (ref 6.1–8.1)

## 2015-01-26 LAB — TSH: TSH: 3.308 u[IU]/mL (ref 0.350–4.500)

## 2016-01-03 ENCOUNTER — Other Ambulatory Visit: Payer: Self-pay | Admitting: Gynecology

## 2016-01-25 ENCOUNTER — Encounter: Payer: Self-pay | Admitting: Gynecology

## 2016-01-25 ENCOUNTER — Ambulatory Visit (INDEPENDENT_AMBULATORY_CARE_PROVIDER_SITE_OTHER): Payer: BLUE CROSS/BLUE SHIELD | Admitting: Gynecology

## 2016-01-25 VITALS — BP 120/70 | Ht 64.0 in | Wt 210.0 lb

## 2016-01-25 DIAGNOSIS — R1900 Intra-abdominal and pelvic swelling, mass and lump, unspecified site: Secondary | ICD-10-CM | POA: Insufficient documentation

## 2016-01-25 DIAGNOSIS — Z01419 Encounter for gynecological examination (general) (routine) without abnormal findings: Secondary | ICD-10-CM | POA: Diagnosis not present

## 2016-01-25 MED ORDER — NORETHINDRONE ACET-ETHINYL EST 1-20 MG-MCG PO TABS: 1.0000 | ORAL_TABLET | Freq: Every day | ORAL | 11 refills | Status: DC

## 2016-01-25 NOTE — Progress Notes (Signed)
Ashley Steele 1976-03-02 [id number]   History:    39 y.o.  for annual gyn exam who states that time she has a slight odor vagina but none today. She is on the Junel 1/20 oral contraceptive pill and is doing well once in a while she'll get a light menstrual cycle. Patient with no past history of any abnormal Pap smears.  Past medical history,surgical history, family history and social history were all reviewed and documented in the EPIC chart.  Gynecologic History Patient's last menstrual period was 11/18/2015. Contraception: OCP (estrogen/progesterone) Last Pap: 2015. Results were: normal Last mammogram: Not indicated. Results were: normal  Obstetric History OB History  Gravida Para Term Preterm AB Living  2 1     1 1   SAB TAB Ectopic Multiple Live Births    1          # Outcome Date GA Lbr Len/2nd Weight Sex Delivery Anes PTL Lv  2 TAB           1 Para                ROS: A ROS was performed and pertinent positives and negatives are included in the history.  GENERAL: No fevers or chills. HEENT: No change in vision, no earache, sore throat or sinus congestion. NECK: No pain or stiffness. CARDIOVASCULAR: No chest pain or pressure. No palpitations. PULMONARY: No shortness of breath, cough or wheeze. GASTROINTESTINAL: No abdominal pain, nausea, vomiting or diarrhea, melena or bright red blood per rectum. GENITOURINARY: No urinary frequency, urgency, hesitancy or dysuria. MUSCULOSKELETAL: No joint or muscle pain, no back pain, no recent trauma. DERMATOLOGIC: No rash, no itching, no lesions. ENDOCRINE: No polyuria, polydipsia, no heat or cold intolerance. No recent change in weight. HEMATOLOGICAL: No anemia or easy bruising or bleeding. NEUROLOGIC: No headache, seizures, numbness, tingling or weakness. PSYCHIATRIC: No depression, no loss of interest in normal activity or change in sleep pattern.     Exam: chaperone present  BP 120/70   Ht 5\' 4"  (1.626 m)   Wt 210 lb (95.3 kg)   LMP  11/18/2015   BMI 36.05 kg/m   Body mass index is 36.05 kg/m.  General appearance : Well developed well nourished female. No acute distress HEENT: Eyes: no retinal hemorrhage or exudates,  Neck supple, trachea midline, no carotid bruits, no thyroidmegaly Lungs: Clear to auscultation, no rhonchi or wheezes, or rib retractions  Heart: Regular rate and rhythm, no murmurs or gallops Breast:Examined in sitting and supine position were symmetrical in appearance, no palpable masses or tenderness,  no skin retraction, no nipple inversion, no nipple discharge, no skin discoloration, no axillary or supraclavicular lymphadenopathy Abdomen: no palpable masses or tenderness, no rebound or guarding Extremities: no edema or skin discoloration or tenderness  Pelvic:  Bartholin, Urethra, Skene Glands: Within normal limits             Vagina: No gross lesions or discharge  Cervix: No gross lesions or discharge  Uterus  anteverted nontender  Adnexa  questionable right adnexal fullness  Anus and perineum  normal   Rectovaginal  normal sphincter tone without palpated masses or tenderness             Hemoccult not indicated     Assessment/Plan:  40 y.o. female for annual exam will return to the office next week in a fasting state for the following screening blood work: Comprehensive metabolic panel, fasting lipid profile, TSH, CBC, and urinalysis. We'll also follow-up with an  ultrasound for better assessment of her right adnexa whether it's a deviated anteverted uterus versus an ovarian adnexal mass will be determined. Pap smear not indicated this year. We discussed importance of monthly breast exams. She'll be due for her Pap smear next year. She will also need a baseline mammogram next year.   Terrance Mass MD, 3:35 PM 01/25/2016

## 2016-02-11 ENCOUNTER — Encounter: Payer: Self-pay | Admitting: Gynecology

## 2016-02-11 ENCOUNTER — Ambulatory Visit (INDEPENDENT_AMBULATORY_CARE_PROVIDER_SITE_OTHER): Payer: BLUE CROSS/BLUE SHIELD

## 2016-02-11 ENCOUNTER — Ambulatory Visit (INDEPENDENT_AMBULATORY_CARE_PROVIDER_SITE_OTHER): Payer: BLUE CROSS/BLUE SHIELD | Admitting: Gynecology

## 2016-02-11 ENCOUNTER — Other Ambulatory Visit: Payer: Self-pay | Admitting: Gynecology

## 2016-02-11 DIAGNOSIS — D251 Intramural leiomyoma of uterus: Secondary | ICD-10-CM | POA: Diagnosis not present

## 2016-02-11 DIAGNOSIS — N852 Hypertrophy of uterus: Secondary | ICD-10-CM

## 2016-02-11 DIAGNOSIS — D252 Subserosal leiomyoma of uterus: Secondary | ICD-10-CM

## 2016-02-11 DIAGNOSIS — R1900 Intra-abdominal and pelvic swelling, mass and lump, unspecified site: Secondary | ICD-10-CM

## 2016-02-11 DIAGNOSIS — D250 Submucous leiomyoma of uterus: Secondary | ICD-10-CM

## 2016-02-11 NOTE — Progress Notes (Addendum)
   Patient 40 year old that presented to the office today to discuss her ultrasound. She was seen the office for annual exam on August 22 and during the pelvic examination she was noted to have an enlarged fibroid uterus or a questionable right adnexal mass were fullness was noted. She was asymptomatic she's on the oral contraceptive pills reporting normal menstrual cycles.  Ultrasound today: Uterus measured 10.9 x 8.5 x 7.2 cm within a meter stripe of 4 mm. A right subserous myoma measuring 6.0 x 4.9 x 6.7 cm was noted along with an intramural left fibroid measuring 2.2 x 2.0 cm and 2.6 x 2.2 x 2.9 cm. Right ovary seen on transabdominal images only due to the fibroid. Left ovary thick wall follicle involuting measuring 13 x 12 mm. Negative CFD. No fluid in the cul-de-sac.  Assessment/plan: 40 year old with asymptomatic fibroid uterus no prior studies to compare and for this reason patient will return back in 6 months for follow-up ultrasound she becomes symptomatic. Patient not interested in having any children in the future in the event that surgical intervention may be needed if the fibroid continues to grow or patient becomes symptomatic. Literature information was provided. We had discussed abdominal hysterectomy bilateral salpingectomy with ovarian conservation versus myomectomy which would not be indicated at her age and not wishing to have any more children. We will wait and see after the follow-up ultrasound in 6 months.  Greater than 50% time was spent counseling correlating care for this patient and reviewing her ultrasound time of consultation 15 minutes

## 2016-02-11 NOTE — Patient Instructions (Signed)
Uterine Fibroids Uterine fibroids are tissue masses (tumors) that can develop in the womb (uterus). They are also called leiomyomas. This type of tumor is not cancerous (benign) and does not spread to other parts of the body outside of the pelvic area, which is between the hip bones. Occasionally, fibroids may develop in the fallopian tubes, in the cervix, or on the support structures (ligaments) that surround the uterus. You can have one or many fibroids. Fibroids can vary in size, weight, and where they grow in the uterus. Some can become quite large. Most fibroids do not require medical treatment. CAUSES A fibroid can develop when a single uterine cell keeps growing (replicating). Most cells in the human body have a control mechanism that keeps them from replicating without control. SIGNS AND SYMPTOMS Symptoms may include:   Heavy bleeding during your period.  Bleeding or spotting between periods.  Pelvic pain and pressure.  Bladder problems, such as needing to urinate more often (urinary frequency) or urgently.  Inability to reproduce offspring (infertility).  Miscarriages. DIAGNOSIS Uterine fibroids are diagnosed through a physical exam. Your health care provider may feel the lumpy tumors during a pelvic exam. Ultrasonography and an MRI may be done to determine the size, location, and number of fibroids. TREATMENT Treatment may include:  Watchful waiting. This involves getting the fibroid checked by your health care provider to see if it grows or shrinks. Follow your health care provider's recommendations for how often to have this checked.  Hormone medicines. These can be taken by mouth or given through an intrauterine device (IUD).  Surgery.  Removing the fibroids (myomectomy) or the uterus (hysterectomy).  Removing blood supply to the fibroids (uterine artery embolization). If fibroids interfere with your fertility and you want to become pregnant, your health care provider  may recommend having the fibroids removed.  HOME CARE INSTRUCTIONS  Keep all follow-up visits as directed by your health care provider. This is important.  Take medicines only as directed by your health care provider.  If you were prescribed a hormone treatment, take the hormone medicines exactly as directed.  Do not take aspirin, because it can cause bleeding.  Ask your health care provider about taking iron pills and increasing the amount of dark green, leafy vegetables in your diet. These actions can help to boost your blood iron levels, which may be affected by heavy menstrual bleeding.  Pay close attention to your period and tell your health care provider about any changes, such as:  Increased blood flow that requires you to use more pads or tampons than usual per month.  A change in the number of days that your period lasts per month.  A change in symptoms that are associated with your period, such as abdominal cramping or back pain. SEEK MEDICAL CARE IF:  You have pelvic pain, back pain, or abdominal cramps that cannot be controlled with medicines.  You have an increase in bleeding between and during periods.  You soak tampons or pads in a half hour or less.  You feel lightheaded, extra tired, or weak. SEEK IMMEDIATE MEDICAL CARE IF:  You faint.  You have a sudden increase in pelvic pain.   This information is not intended to replace advice given to you by your health care provider. Make sure you discuss any questions you have with your health care provider.   Document Released: 05/19/2000 Document Revised: 06/12/2014 Document Reviewed: 11/18/2013 Elsevier Interactive Patient Education 2016 Elsevier Inc.  

## 2016-08-09 ENCOUNTER — Ambulatory Visit (INDEPENDENT_AMBULATORY_CARE_PROVIDER_SITE_OTHER): Payer: BLUE CROSS/BLUE SHIELD

## 2016-08-09 ENCOUNTER — Ambulatory Visit (INDEPENDENT_AMBULATORY_CARE_PROVIDER_SITE_OTHER): Payer: BLUE CROSS/BLUE SHIELD | Admitting: Gynecology

## 2016-08-09 ENCOUNTER — Other Ambulatory Visit: Payer: Self-pay | Admitting: Gynecology

## 2016-08-09 ENCOUNTER — Encounter: Payer: Self-pay | Admitting: Gynecology

## 2016-08-09 VITALS — BP 126/78

## 2016-08-09 DIAGNOSIS — D251 Intramural leiomyoma of uterus: Secondary | ICD-10-CM

## 2016-08-09 DIAGNOSIS — N852 Hypertrophy of uterus: Secondary | ICD-10-CM

## 2016-08-09 DIAGNOSIS — D252 Subserosal leiomyoma of uterus: Secondary | ICD-10-CM

## 2016-08-09 DIAGNOSIS — N939 Abnormal uterine and vaginal bleeding, unspecified: Secondary | ICD-10-CM | POA: Diagnosis not present

## 2016-08-09 DIAGNOSIS — D250 Submucous leiomyoma of uterus: Secondary | ICD-10-CM

## 2016-08-09 MED ORDER — NORETHINDRONE ACET-ETHINYL EST 1-20 MG-MCG PO TABS: ORAL_TABLET | ORAL | 4 refills | Status: DC

## 2016-08-09 NOTE — Progress Notes (Signed)
   Patient is a 41 year old that presented to the office today for 6 month follow-up on her fibroid uterus. Patient was originally seen on August 22 for her annual exam and it was during this time during the pelvic examination she was noted to have an enlarged fibroid uterus or a questionable right adnexal mass were fullness was noted. She was asymptomatic she's on the oral contraceptive pills reporting normal menstrual cycles.  Ultrasound today: Uterus measured 10.9 x 8.5 x 7.2 cm within a meter stripe of 4 mm. A right subserous myoma measuring 6.0 x 4.9 x 6.7 cm was noted along with an intramural left fibroid measuring 2.2 x 2.0 cm and 2.6 x 2.2 x 2.9 cm. Right ovary seen on transabdominal images only due to the fibroid. Left ovary thick wall follicle involuting measuring 13 x 12 mm. Negative CFD. No fluid in the cul-de-sac.  Patient is otherwise asymptomatic. Patient not interested in having children in the future.  Ultrasound today: Uterus measured 11.0 x 7.9 x 7.4 cm endometrial stripe of 5.3 mm. Several intramural and subserous myomas were noted the largest one measured 6.6 x 5.4 x 5.8 cm. A right ovarian follicle measuring 18 x 18 mm was noted left ovary was normal and no fluid in the cul-de-sac.  Assessment/plan: Stable fibroid uterus patient not interested in surgery at the present time we had discussed possibility of myomectomy to maintain her fertility or hysterectomy or continued to monitor yearly. She would like to continue to monitor yearly. She will continue on the oral contraceptive pill and to cut down on her heavy menstrual cycles she is going to be switch from cyclical once a month bleeding 2 cycles every 3 months at taking the pill continuous as instructed.  Gravida 90% time was spent counseling according care for this patient. Time of consultation 15 minutes

## 2016-08-09 NOTE — Patient Instructions (Addendum)
Abdominal Hysterectomy °Abdominal hysterectomy is a surgical procedure to remove the womb (uterus). The uterus is the muscular organ that houses a developing baby. This surgery may be done if: °· You have cancer. °· You have growths (tumors or fibroids) in the uterus. °· You have long-term (chronic) pain. °· You are bleeding. °· Your uterus has slipped down into your vagina (uterine prolapse). °· You have a condition in which the tissue that lines the uterus grows outside of its normal location (endometriosis). °· You have an infection in your uterus. °· You are having problems with your menstrual cycle. ° °Depending on why you are having this procedure, you may also have other reproductive organs removed. These could include: °· The part of your vagina that connects with your uterus (cervix). °· The organs that make eggs (ovaries). °· The tubes that connect the ovaries to the uterus (fallopian tubes). ° °Tell a health care provider about: °· Any allergies you have. °· All medicines you are taking, including vitamins, herbs, eye drops, creams, and over-the-counter medicines. °· Any problems you or family members have had with anesthetic medicines. °· Any blood disorders you have. °· Any surgeries you have had. °· Any medical conditions you have. °· Whether you are pregnant or may be pregnant. °What are the risks? °Generally, this is a safe procedure. However, problems may occur, including: °· Bleeding. °· Infection. °· Allergic reactions to medicines or dyes. °· Damage to other structures or organs. °· Nerve injury. °· Decreased interest in sex or pain during sex. °· Blood clots that can break free and travel to your lungs. ° °What happens before the procedure? °Staying hydrated °Follow instructions from your health care provider about hydration, which may include: °· Up to 2 hours before the procedure - you may continue to drink clear liquids, such as water, clear fruit juice, black coffee, and plain tea ° °Eating  and drinking restrictions °Follow instructions from your health care provider about eating and drinking, which may include: °· 8 hours before the procedure - stop eating heavy meals or foods such as meat, fried foods, or fatty foods. °· 6 hours before the procedure - stop eating light meals or foods, such as toast or cereal. °· 6 hours before the procedure - stop drinking milk or drinks that contain milk. °· 2 hours before the procedure - stop drinking clear liquids. ° °Medicines °· Ask your health care provider about: °? Changing or stopping your regular medicines. This is especially important if you are taking diabetes medicines or blood thinners. °? Taking medicines such as aspirin and ibuprofen. These medicines can thin your blood. Do not take these medicines before your procedure if your health care provider instructs you not to. °· You may be given antibiotic medicine to help prevent infection. Take it as told by your health care provider. °· You may be asked to take laxatives to prevent constipation. °General instructions °· Ask your health care provider how your surgical site will be marked or identified. °· You may be asked to shower with a germ-killing soap. °· Plan to have someone take you home from the hospital. °· Do not use any products that contain nicotine or tobacco, such as cigarettes and e-cigarettes. If you need help quitting, ask your health care provider. °· You may have an exam or testing. °· You may have a blood or urine sample taken. °· You may need to have an enema to clean out your rectum and lower colon. °· This   can affect the way you feel about yourself. Talk to your health care provider about the physical and emotional changes this procedure may cause. What happens during the procedure?  To lower your risk of infection:  Your health care team will wash or sanitize their hands.  Your skin will be washed with soap.  Hair may be removed from the surgical area.  An IV tube  will be inserted into one of your veins.  You will be given one or more of the following:  A medicine to help you relax (sedative).  A medicine to make you fall asleep (general anesthetic).  Tight-fitting (compression) stockings will be placed on your legs to promote circulation.  A thin, flexible tube (catheter) will be inserted to help drain your urine.  The surgeon will make a cut (incision) through the skin in your lower belly. The incision may go side-to-side or up-and-down.  The surgeon will move aside the body tissue that covers your uterus. The surgeon will then carefully take out your uterus along with any of the other organs that need to be removed.  Bleeding will be controlled with clamps or sutures.  The surgeon will close your incision with stitches (sutures), skin glue, or adhesive strips.  A bandage (dressing) will be placed over the incision. The procedure may vary among health care providers and hospitals. What happens after the procedure?  You will be given pain medicine as needed.  Your blood pressure, heart rate, breathing rate, and blood oxygen level will be monitored until the medicines you were given have worn off.  You will need to stay in the hospital to recover for one to two days. Ask your health care provider how long you will need to stay in the hospital after your procedure.  You may have a liquid diet at first. You will most likely return to your usual diet the day after surgery.  You will still have the urinary catheter in place. It will likely be removed the day after surgery.  You may have to wear compression stockings. These stockings help to prevent blood clots and reduce swelling in your legs.  You will be encouraged to walk as soon as possible. You will also use a device or do breathing exercises to keep your lungs clear.  You may need to use a sanitary napkin for vaginal discharge. Summary  Abdominal hysterectomy is a surgical procedure  to remove the womb (uterus). The uterus is the muscular organ that houses a developing baby.  This procedure can affect the way you feel about yourself. Talk to your health care provider about the physical and emotional changes this procedure may cause.  You will be given medicines for pain after the procedure.  You will need to stay in the hospital to recover. Ask your health care provider how long you will need to stay in the hospital after your procedure. This information is not intended to replace advice given to you by your health care provider. Make sure you discuss any questions you have with your health care provider. Document Released: 05/27/2013 Document Revised: 05/10/2016 Document Reviewed: 05/10/2016 Elsevier Interactive Patient Education  2017 [employer] Uterine Fibroids Uterine fibroids are tissue masses (tumors) that can develop in the womb (uterus). They are also called leiomyomas. This type of tumor is not cancerous (benign) and does not spread to other parts of the body outside of the pelvic area, which is between the hip bones. Occasionally, fibroids may develop in the fallopian tubes,  in the cervix, or on the support structures (ligaments) that surround the uterus. You can have one or many fibroids. Fibroids can vary in size, weight, and where they grow in the uterus. Some can become quite large. Most fibroids do not require medical treatment. What are the causes? A fibroid can develop when a single uterine cell keeps growing (replicating). Most cells in the human body have a control mechanism that keeps them from replicating without control. What are the signs or symptoms? Symptoms may include:  Heavy bleeding during your period.  Bleeding or spotting between periods.  Pelvic pain and pressure.  Bladder problems, such as needing to urinate more often (urinary frequency) or urgently.  Inability to reproduce offspring (infertility).  Miscarriages. How is this  diagnosed? Uterine fibroids are diagnosed through a physical exam. Your health care provider may feel the lumpy tumors during a pelvic exam. Ultrasonography and an MRI may be done to determine the size, location, and number of fibroids. How is this treated? Treatment may include:  Watchful waiting. This involves getting the fibroid checked by your health care provider to see if it grows or shrinks. Follow your health care provider's recommendations for how often to have this checked.  Hormone medicines. These can be taken by mouth or given through an intrauterine device (IUD).  Surgery.  Removing the fibroids (myomectomy) or the uterus (hysterectomy).  Removing blood supply to the fibroids (uterine artery embolization). If fibroids interfere with your fertility and you want to become pregnant, your health care provider may recommend having the fibroids removed. Follow these instructions at home:  Keep all follow-up visits as directed by your health care provider. This is important.  Take over-the-counter and prescription medicines only as told by your health care provider.  If you were prescribed a hormone treatment, take the hormone medicines exactly as directed.  Ask your health care provider about taking iron pills and increasing the amount of dark green, leafy vegetables in your diet. These actions can help to boost your blood iron levels, which may be affected by heavy menstrual bleeding.  Pay close attention to your period and tell your health care provider about any changes, such as:  Increased blood flow that requires you to use more pads or tampons than usual per month.  A change in the number of days that your period lasts per month.  A change in symptoms that are associated with your period, such as abdominal cramping or back pain. Contact a health care provider if:  You have pelvic pain, back pain, or abdominal cramps that cannot be controlled with medicines.  You have  an increase in bleeding between and during periods.  You soak tampons or pads in a half hour or less.  You feel lightheaded, extra tired, or weak. Get help right away if:  You faint.  You have a sudden increase in pelvic pain. This information is not intended to replace advice given to you by your health care provider. Make sure you discuss any questions you have with your health care provider. Document Released: 05/19/2000 Document Revised: 01/20/2016 Document Reviewed: 11/18/2013 Elsevier Interactive Patient Education  2017 [employer].

## 2016-08-25 ENCOUNTER — Ambulatory Visit (INDEPENDENT_AMBULATORY_CARE_PROVIDER_SITE_OTHER): Payer: BLUE CROSS/BLUE SHIELD | Admitting: Women's Health

## 2016-08-25 ENCOUNTER — Encounter: Payer: Self-pay | Admitting: Women's Health

## 2016-08-25 VITALS — BP 132/90

## 2016-08-25 DIAGNOSIS — L298 Other pruritus: Secondary | ICD-10-CM | POA: Diagnosis not present

## 2016-08-25 DIAGNOSIS — R238 Other skin changes: Secondary | ICD-10-CM | POA: Diagnosis not present

## 2016-08-25 DIAGNOSIS — N898 Other specified noninflammatory disorders of vagina: Secondary | ICD-10-CM

## 2016-08-25 LAB — WET PREP FOR TRICH, YEAST, CLUE
Clue Cells Wet Prep HPF POC: NONE SEEN
Trich, Wet Prep: NONE SEEN
Yeast Wet Prep HPF POC: NONE SEEN

## 2016-08-25 MED ORDER — NYSTATIN-TRIAMCINOLONE 100000-0.1 UNIT/GM-% EX OINT: 1.0000 "application " | TOPICAL_OINTMENT | Freq: Two times a day (BID) | CUTANEOUS | 0 refills | Status: DC

## 2016-08-25 MED ORDER — FLUCONAZOLE 150 MG PO TABS: 150.0000 mg | ORAL_TABLET | Freq: Once | ORAL | 1 refills | Status: AC

## 2016-08-25 NOTE — Patient Instructions (Signed)
Lichen Planus Lichen planus is a skin problem. It causes redness, itching, small bumps, and sores. Areas of the body that are often affected include:  Arms, wrists, legs, or ankles.  Chest, back, or belly (abdomen).  Genital areas such as the vulva and vagina.  Gums and inside of the mouth.  Scalp.  Fingernails or toenails. Treatment can help to control symptoms. This condition is not passed from one person to another (not contagious). It can last for a long time. Follow these instructions at home:  Take over-the-counter and prescription medicines only as told by your doctor.  Use creams or ointments as told by your doctor.  Do not scratch the affected areas of skin.  Women should keep the vagina as clean and dry as they can. Contact a doctor if:  Your redness, swelling, or pain gets worse.  You have fluid, blood, or pus coming from the affected area.  Your eyes become irritated. This information is not intended to replace advice given to you by your health care provider. Make sure you discuss any questions you have with your health care provider. Document Released: 05/04/2008 Document Revised: 10/28/2015 Document Reviewed: 08/17/2014 Elsevier Interactive Patient Education  2017 [employer].

## 2016-08-25 NOTE — Progress Notes (Signed)
Presents with complaint of itching, redness and irritation on the inguinal creases, inner thighs and super pubic area, symptoms started 2 months ago. No relief with cold showers, loose clothing, symptoms are worse in the afternoon and bed  time. No change in routine. Monthly regular cycles, on Junel 1/20 mg-mcg. History of leiomyoma of uterus.   Exam: appears well. No visible rash noted mild darkening of the skin. External genitalia erythematous at introitus. Speculum exam minimal discharge, mild erythema to vaginal walls, no odor.Wet prep negative.  External skin itching Probable Yeast infection  Plan: Mycolog 1 application BID daily, Diflucan 150 mg one time, benadryl 25 mg BID as needed. Instructed to call if no relief of symptoms.

## 2016-10-18 ENCOUNTER — Encounter: Payer: Self-pay | Admitting: Gynecology

## 2016-11-15 ENCOUNTER — Ambulatory Visit (INDEPENDENT_AMBULATORY_CARE_PROVIDER_SITE_OTHER): Payer: BLUE CROSS/BLUE SHIELD | Admitting: Gynecology

## 2016-11-15 ENCOUNTER — Encounter: Payer: Self-pay | Admitting: Gynecology

## 2016-11-15 VITALS — BP 138/94 | Wt 206.6 lb

## 2016-11-15 DIAGNOSIS — B9689 Other specified bacterial agents as the cause of diseases classified elsewhere: Secondary | ICD-10-CM

## 2016-11-15 DIAGNOSIS — Z113 Encounter for screening for infections with a predominantly sexual mode of transmission: Secondary | ICD-10-CM | POA: Diagnosis not present

## 2016-11-15 DIAGNOSIS — N898 Other specified noninflammatory disorders of vagina: Secondary | ICD-10-CM | POA: Diagnosis not present

## 2016-11-15 DIAGNOSIS — N760 Acute vaginitis: Secondary | ICD-10-CM | POA: Diagnosis not present

## 2016-11-15 LAB — WET PREP FOR TRICH, YEAST, CLUE
CLUE CELLS WET PREP: NONE SEEN
Trich, Wet Prep: NONE SEEN
Yeast Wet Prep HPF POC: NONE SEEN

## 2016-11-15 MED ORDER — METRONIDAZOLE 500 MG PO TABS: 500.0000 mg | ORAL_TABLET | Freq: Two times a day (BID) | ORAL | 0 refills | Status: DC

## 2016-11-15 NOTE — Patient Instructions (Signed)
Metronidazole tablets or capsules What is this medicine? METRONIDAZOLE (me troe NI da zole) is an antiinfective. It is used to treat certain kinds of bacterial and protozoal infections. It will not work for colds, flu, or other viral infections. This medicine may be used for other purposes; ask your health care provider or pharmacist if you have questions. COMMON BRAND NAME(S): Flagyl What should I tell my health care provider before I take this medicine? They need to know if you have any of these conditions: -anemia or other blood disorders -disease of the nervous system -fungal or yeast infection -if you drink alcohol containing drinks -liver disease -seizures -an unusual or allergic reaction to metronidazole, or other medicines, foods, dyes, or preservatives -pregnant or trying to get pregnant -breast-feeding How should I use this medicine? Take this medicine by mouth with a full glass of water. Follow the directions on the prescription label. Take your medicine at regular intervals. Do not take your medicine more often than directed. Take all of your medicine as directed even if you think you are better. Do not skip doses or stop your medicine early. Talk to your pediatrician regarding the use of this medicine in children. Special care may be needed. Overdosage: If you think you have taken too much of this medicine contact a poison control center or emergency room at once. NOTE: This medicine is only for you. Do not share this medicine with others. What if I miss a dose? If you miss a dose, take it as soon as you can. If it is almost time for your next dose, take only that dose. Do not take double or extra doses. What may interact with this medicine? Do not take this medicine with any of the following medications: -alcohol or any product that contains alcohol -amprenavir oral solution -cisapride -disulfiram -dofetilide -dronedarone -paclitaxel injection -pimozide -ritonavir oral  solution -sertraline oral solution -sulfamethoxazole-trimethoprim injection -thioridazine -ziprasidone This medicine may also interact with the following medications: -birth control pills -cimetidine -lithium -other medicines that prolong the QT interval (cause an abnormal heart rhythm) -phenobarbital -phenytoin -warfarin This list may not describe all possible interactions. Give your health care provider a list of all the medicines, herbs, non-prescription drugs, or dietary supplements you use. Also tell them if you smoke, drink alcohol, or use illegal drugs. Some items may interact with your medicine. What should I watch for while using this medicine? Tell your doctor or health care professional if your symptoms do not improve or if they get worse. You may get drowsy or dizzy. Do not drive, use machinery, or do anything that needs mental alertness until you know how this medicine affects you. Do not stand or sit up quickly, especially if you are an older patient. This reduces the risk of dizzy or fainting spells. Avoid alcoholic drinks while you are taking this medicine and for three days afterward. Alcohol may make you feel dizzy, sick, or flushed. If you are being treated for a sexually transmitted disease, avoid sexual contact until you have finished your treatment. Your sexual partner may also need treatment. What side effects may I notice from receiving this medicine? Side effects that you should report to your doctor or health care professional as soon as possible: -allergic reactions like skin rash or hives, swelling of the face, lips, or tongue -confusion, clumsiness -difficulty speaking -discolored or sore mouth -dizziness -fever, infection -numbness, tingling, pain or weakness in the hands or feet -trouble passing urine or change in the amount of   urine -redness, blistering, peeling or loosening of the skin, including inside the mouth -seizures -unusually weak or  tired -vaginal irritation, dryness, or discharge Side effects that usually do not require medical attention (report to your doctor or health care professional if they continue or are bothersome): -diarrhea -headache -irritability -metallic taste -nausea -stomach pain or cramps -trouble sleeping This list may not describe all possible side effects. Call your doctor for medical advice about side effects. You may report side effects to FDA at 1-800-FDA-1088. Where should I keep my medicine? Keep out of the reach of children. Store at room temperature below 25 degrees C (77 degrees F). Protect from light. Keep container tightly closed. Throw away any unused medicine after the expiration date. NOTE: This sheet is a summary. It may not cover all possible information. If you have questions about this medicine, talk to your doctor, pharmacist, or health care provider.  2018 Elsevier/Gold Standard (2012-12-27 14:08:39) Bacterial Vaginosis Bacterial vaginosis is a vaginal infection that occurs when the normal balance of bacteria in the vagina is disrupted. It results from an overgrowth of certain bacteria. This is the most common vaginal infection among women ages 15-44. Because bacterial vaginosis increases your risk for STIs (sexually transmitted infections), getting treated can help reduce your risk for chlamydia, gonorrhea, herpes, and HIV (human immunodeficiency virus). Treatment is also important for preventing complications in pregnant women, because this condition can cause an early (premature) delivery. What are the causes? This condition is caused by an increase in harmful bacteria that are normally present in small amounts in the vagina. However, the reason that the condition develops is not fully understood. What increases the risk? The following factors may make you more likely to develop this condition:  Having a new sexual partner or multiple sexual partners.  Having unprotected  sex.  Douching.  Having an intrauterine device (IUD).  Smoking.  Drug and alcohol abuse.  Taking certain antibiotic medicines.  Being pregnant.  You cannot get bacterial vaginosis from toilet seats, bedding, swimming pools, or contact with objects around you. What are the signs or symptoms? Symptoms of this condition include:  Grey or white vaginal discharge. The discharge can also be watery or foamy.  A fish-like odor with discharge, especially after sexual intercourse or during menstruation.  Itching in and around the vagina.  Burning or pain with urination.  Some women with bacterial vaginosis have no signs or symptoms. How is this diagnosed? This condition is diagnosed based on:  Your medical history.  A physical exam of the vagina.  Testing a sample of vaginal fluid under a microscope to look for a large amount of bad bacteria or abnormal cells. Your health care provider may use a cotton swab or a small wooden spatula to collect the sample.  How is this treated? This condition is treated with antibiotics. These may be given as a pill, a vaginal cream, or a medicine that is put into the vagina (suppository). If the condition comes back after treatment, a second round of antibiotics may be needed. Follow these instructions at home: Medicines  Take over-the-counter and prescription medicines only as told by your health care provider.  Take or use your antibiotic as told by your health care provider. Do not stop taking or using the antibiotic even if you start to feel better. General instructions  If you have a female sexual partner, tell her that you have a vaginal infection. She should see her health care provider and be treated if   she has symptoms. If you have a female sexual partner, he does not need treatment.  During treatment: ? Avoid sexual activity until you finish treatment. ? Do not douche. ? Avoid alcohol as directed by your health care provider. ? Avoid  breastfeeding as directed by your health care provider.  Drink enough water and fluids to keep your urine clear or pale yellow.  Keep the area around your vagina and rectum clean. ? Wash the area daily with warm water. ? Wipe yourself from front to back after using the toilet.  Keep all follow-up visits as told by your health care provider. This is important. How is this prevented?  Do not douche.  Wash the outside of your vagina with warm water only.  Use protection when having sex. This includes latex condoms and dental dams.  Limit how many sexual partners you have. To help prevent bacterial vaginosis, it is best to have sex with just one partner (monogamous).  Make sure you and your sexual partner are tested for STIs.  Wear cotton or cotton-lined underwear.  Avoid wearing tight pants and pantyhose, especially during summer.  Limit the amount of alcohol that you drink.  Do not use any products that contain nicotine or tobacco, such as cigarettes and e-cigarettes. If you need help quitting, ask your health care provider.  Do not use illegal drugs. Where to find more information:  Centers for Disease Control and Prevention: www.cdc.gov/std  American Sexual Health Association (ASHA): www.ashastd.org  [place] Department of Health and Human Services, Office on Women's Health: www.womenshealth.gov/ or https://www.womenshealth.gov/a-z-topics/bacterial-vaginosis Contact a health care provider if:  Your symptoms do not improve, even after treatment.  You have more discharge or pain when urinating.  You have a fever.  You have pain in your abdomen.  You have pain during sex.  You have vaginal bleeding between periods. Summary  Bacterial vaginosis is a vaginal infection that occurs when the normal balance of bacteria in the vagina is disrupted.  Because bacterial vaginosis increases your risk for STIs (sexually transmitted infections), getting treated can help reduce your  risk for chlamydia, gonorrhea, herpes, and HIV (human immunodeficiency virus). Treatment is also important for preventing complications in pregnant women, because the condition can cause an early (premature) delivery.  This condition is treated with antibiotic medicines. These may be given as a pill, a vaginal cream, or a medicine that is put into the vagina (suppository). This information is not intended to replace advice given to you by your health care provider. Make sure you discuss any questions you have with your health care provider. Document Released: 05/22/2005 Document Revised: 02/05/2016 Document Reviewed: 02/05/2016 Elsevier Interactive Patient Education  2017 [employer] Inc.  

## 2016-11-15 NOTE — Progress Notes (Signed)
   Patient is a 41 year old that presented to the office today stating that for the past 38 she's had a vaginal odor fishy like. She denies any discharge. She has not been sexually active since the beginning of April. She reports normal menstrual cycles and currently on the oral contraceptive pill. Patient denies any fever, chills, nausea or vomiting or any back pain. No GI complaints.  Exam: Well developed well nourished female in no acute distress Back: No CVA tenderness Abdomen: Soft nontender no rebound or guarding Pelvic: Bartholin urethra Skene was within normal limits Vagina clear discharge fishy odor Cervix: No gross lesions on inspection Bimanual exam not done Rectal exam: Not done  Wet prep many bacteria few white blood cells  Assessment/plan: Based on patient's symptoms highly suspicious for underlying bacterial vaginosis although clue cells not identified I'm going to start her on Flagyl 500 mg twice a day for 7 days. We did do a GC and chlamydia culture results pending at time of this dictation. Patient is reminded she needs her annual exam at the end of August or beginning of September this year.

## 2016-11-16 LAB — GC/CHLAMYDIA PROBE AMP
CT PROBE, AMP APTIMA: NOT DETECTED
GC PROBE AMP APTIMA: NOT DETECTED

## 2016-11-28 ENCOUNTER — Emergency Department (HOSPITAL_COMMUNITY): Payer: BLUE CROSS/BLUE SHIELD

## 2016-11-28 ENCOUNTER — Encounter (HOSPITAL_COMMUNITY): Payer: Self-pay | Admitting: *Deleted

## 2016-11-28 ENCOUNTER — Emergency Department (HOSPITAL_COMMUNITY)
Admission: EM | Admit: 2016-11-28 | Discharge: 2016-11-29 | Disposition: A | Discharge status: Home / Self Care | Payer: BLUE CROSS/BLUE SHIELD | Attending: Emergency Medicine | Admitting: Emergency Medicine

## 2016-11-28 DIAGNOSIS — Z79899 Other long term (current) drug therapy: Secondary | ICD-10-CM | POA: Insufficient documentation

## 2016-11-28 DIAGNOSIS — I10 Essential (primary) hypertension: Secondary | ICD-10-CM | POA: Diagnosis not present

## 2016-11-28 DIAGNOSIS — M5441 Lumbago with sciatica, right side: Secondary | ICD-10-CM | POA: Diagnosis not present

## 2016-11-28 DIAGNOSIS — G8929 Other chronic pain: Secondary | ICD-10-CM | POA: Diagnosis not present

## 2016-11-28 DIAGNOSIS — M79604 Pain in right leg: Secondary | ICD-10-CM | POA: Diagnosis present

## 2016-11-28 HISTORY — DX: Anxiety disorder, unspecified: F41.9

## 2016-11-28 HISTORY — DX: Essential (primary) hypertension: I10

## 2016-11-28 LAB — POC URINE PREG, ED: PREG TEST UR: NEGATIVE

## 2016-11-28 MED ORDER — KETOROLAC TROMETHAMINE 30 MG/ML IJ SOLN: 30.0000 mg | Freq: Once | INTRAMUSCULAR | Status: DC

## 2016-11-28 MED ORDER — KETOROLAC TROMETHAMINE 60 MG/2ML IM SOLN
60.0000 mg | Freq: Once | INTRAMUSCULAR | Status: AC
  Administered 2016-11-28: 60 mg via INTRAMUSCULAR
  Filled 2016-11-28: qty 2

## 2016-11-28 NOTE — ED Provider Notes (Signed)
[facility] DEPT Provider Note   CSN: [id number] Arrival date & time: 11/28/16  1928  By signing my name below, I, Marcello Moores, attest that this documentation has been prepared under the direction and in the presence of Providence Lanius, [place]. Electronically Signed: Marcello Moores, ED Scribe. 11/28/16. 9:25 PM.  History   Chief Complaint Chief Complaint  Patient presents with  . Leg Pain   The history is provided by the patient. No language interpreter was used.   HPI Comments: Ashley Steele is a 41 y.o. female with PMH/o chronic back pain who presents to the Emergency Department who presents with 2 weeks of RLE pain. Patient states that over the last 2 weeks she has had "sharp, burning" pain that radiates down the posterior aspect of her right leg. She also reports that she has intermittently been experiencing some pain to the right upper leg that she feels is separate. Patient states that her pain is worse in the morning. She has been able to ambulate without difficulty and states that she has been able to go to work despite the symptoms. She has been taking ibuprofen with minimal relief. Her last dose was yesterday. She had a brief episode of numbness to the leg this morning but states that it has resolved. She denies any alleviating factors. She denies any recent trauma or injury.  Patient has a history of chronic back pain and has previously been evaluated by orthopedics. She had a normal MRI of the L spine in 2016. She states that she has seen Urgent Care for her back pain and intermittently receives steroid injections which temporarily help her pain. Denies fevers, weight loss, numbness/weakness of upper and lower extremities, bowel/bladder incontinence, saddle anesthesia, history of back surgery, history of IVDA.    Past Medical History:  Diagnosis Date  . Anxiety   . Hypertension     Patient Active Problem List   Diagnosis Date Noted  . Intramural and subserous leiomyoma  of uterus 08/09/2016    History reviewed. No pertinent surgical history.  OB History    Gravida Para Term Preterm AB Living   2 1     1 1    SAB TAB Ectopic Multiple Live Births     1             Home Medications    Prior to Admission medications   Medication Sig Start Date End Date Taking? Authorizing Provider  ADVAIR DISKUS 250-50 MCG/DOSE AEPB Take 2 puffs by mouth daily. 08/23/16   [provider]  atenolol (TENORMIN) 25 MG tablet Take 1 tablet by mouth 2 (two) times daily. 10/01/16   [provider]  clonazePAM (KLONOPIN) 1 MG tablet Take 1 mg by mouth 2 (two) times daily.    [provider]  COMBIVENT RESPIMAT 20-100 MCG/ACT AERS respimat as needed. 11/05/14   [provider]  cyclobenzaprine (FLEXERIL) 10 MG tablet Take 1 tablet (10 mg total) by mouth 2 (two) times daily as needed for muscle spasms. 11/29/16   Volanda Napoleon, PA-C  fluticasone-salmeterol (ADVAIR HFA) 938-10 MCG/ACT inhaler Inhale 2 puffs into the lungs 2 (two) times daily.    [provider]  metroNIDAZOLE (FLAGYL) 500 MG tablet Take 1 tablet (500 mg total) by mouth 2 (two) times daily. 11/15/16   Terrance Mass, MD  Multiple Vitamin (MULTIVITAMIN) tablet Take 1 tablet by mouth daily.    [provider]  norethindrone-ethinyl estradiol (JUNEL 1/20) 1-20 MG-MCG tablet To take continuous to withdraw every  3 months 08/09/16   Terrance Mass, MD  nystatin-triamcinolone ointment [facility]) Apply 1 application topically 2 (two) times daily. 08/25/16   Huel Cote, NP  predniSONE (DELTASONE) 10 MG tablet Take 2 tablets (20 mg total) by mouth daily. 11/29/16   Volanda Napoleon, PA-C    Family History Family History  Problem Relation Age of Onset  . Other Mother        MVA  . Hypertension Father   . Dementia Maternal Grandmother     Social History Social History  Substance Use Topics  . Smoking status: Never Smoker  . Smokeless tobacco: Never Used  .  Alcohol use 0.0 oz/week     Comment: occassionally     Allergies   Patient has no known allergies.   Review of Systems Review of Systems  Constitutional: Negative for fever.  Cardiovascular: Negative for leg swelling.  Genitourinary:       No bowel incontincence  Musculoskeletal: Positive for back pain.       Right leg pain  Skin: Negative for rash.  Neurological: Positive for numbness (Resolved). Negative for weakness.     Physical Exam Updated Vital Signs BP 119/78 (BP Location: Left Arm)   Pulse 75   Temp 98.7 F (37.1 C) (Oral)   Resp 16   Ht 5\' 4"  (1.626 m)   Wt 93.4 kg (206 lb)   LMP 11/26/2016   SpO2 100%   BMI 35.36 kg/m   Physical Exam  Constitutional: She appears well-developed and well-nourished.  Appears uncomfortable   HENT:  Head: Normocephalic and atraumatic.  Eyes: Conjunctivae and EOM are normal. Right eye exhibits no discharge. Left eye exhibits no discharge. No scleral icterus.  Neck: Full passive range of motion without pain. No spinous process tenderness and no muscular tenderness present.  Full flexion/extension and lateral movement of neck fully intact. No bony midline tenderness. No deformities or crepitus.   Cardiovascular: Normal rate, regular rhythm and normal pulses.   Pulmonary/Chest: Effort normal.  Abdominal: There is no CVA tenderness.  Musculoskeletal:       Cervical back: She exhibits no tenderness.       Thoracic back: She exhibits no tenderness.  Diffuse right sided lumbar paraspinal tenderness. No midline bony tenderness. No deformities or crepitus Tenderness to palpation to the right anterior femur with no overlying echymosis, warmth or erythema. No tenderness palpation to the right hip. No deformity or crepitus. Flexion/extension of right lower extremity intact. No tenderness to palpation to knees or ankle. No bilateral lower extremity edema, erythema or warmth. No tenderness palpation to calves.   Neurological: She is alert.    Follows commands, Moves all extremities  5/5 strength to to RUE, LUE and LLE. Limited ROM of RLE secondary to patient's pain and poor effort. Strength is intact of RLE and patient is able push against resistance from examiner. Equal plantar flexion and dorsiflexion.  Sensation intact throughout all major nerve distributions No gait abnormalities Positive straight leg test raise on right   Skin: Skin is warm and dry. Capillary refill takes less than 2 seconds. No rash noted.  No warmth, erythema, induration, edema, palpable mass, fluctuance noted to the right lower extremity.  Psychiatric: She has a normal mood and affect. Her speech is normal and behavior is normal.  Nursing note and vitals reviewed.    ED Treatments / Results   DIAGNOSTIC STUDIES: Oxygen Saturation is 100% on RA, normal by my interpretation.   COORDINATION OF CARE: 9:24 PM-Discussed  next steps with pt. Pt verbalized understanding and is agreeable with the plan.    Labs (all labs ordered are listed, but only abnormal results are displayed) Labs Reviewed  POC URINE PREG, ED    EKG  EKG Interpretation None       Radiology Dg Femur Min 2 Views Right  Result Date: 11/28/2016 CLINICAL DATA:  Severe right leg pain for 2 weeks EXAM: RIGHT FEMUR 2 VIEWS COMPARISON:  None. FINDINGS: There is no evidence of fracture or other focal bone lesions. Soft tissues are unremarkable. IMPRESSION: Negative. Electronically Signed   By: Donavan Foil M.D.   On: 11/28/2016 23:28    Procedures Procedures (including critical care time)  Medications Ordered in ED Medications  ketorolac (TORADOL) injection 60 mg (60 mg Intramuscular Given 11/28/16 2131)  dexamethasone (DECADRON) injection 10 mg (10 mg Intramuscular Given 11/29/16 0054)     Initial Impression / Assessment and Plan / ED Course  I have reviewed the triage vital signs and the nursing notes.  Pertinent labs & imaging results that were available during my care  of the patient were reviewed by me and considered in my medical decision making (see chart for details).     41 year old female with chronic back pain who presents with 2 weeks of right lower extremity pain. No history  of fall or trauma. No red flag symptoms. Patient initially had limited movement and strength of RLE secondary to pain. When patient persists with movement of RLE despite the pain, she has 5/5 strength of RLE. No neuro deficits on exam. History/physical are concerning for sciatica.  Also consider muscular strain versus mechanical back pain versus exacerbation of chronic back pain. History/physical exam are not concerning for spinal abscess or cauda equina. History/physical exam are not concerning for shingles or DVT. Analgesics provided in the department. Given lack of trauma and no red flags in history/physical exam, imaging of the spine is not indicated at this time. Will obtain XR of right femur.   Patient was able to ambulate to the bathroom in the department without difficulty.   X-ray reviewed. Negative for any acute fracture or dislocation. Discussed results with patient. Re-evaluation: Patient reports minimal improvement in pain after Toradol. We will refrain from giving any sharp pain medications as patient is driving herself home. Will plan to treat as sciatica given history/physical presentation. Patient has been able to ambulate in the department without difficulty. Patient stable for discharge at this time. Will plan to treat symptomatically. Patient is asking for an Ace wrap to help with support and stabilization of the leg. Patient instructed follow-up with her primary care doctor and orthopedic doctor for further evaluation. Strict return precautions discussed. Patient expresses understanding and agreement to plan.   Final Clinical Impressions(s) / ED Diagnoses   Final diagnoses:  Chronic right-sided low back pain with right-sided sciatica    New  Prescriptions Discharge Medication List as of 11/29/2016  1:00 AM    START taking these medications   Details  cyclobenzaprine (FLEXERIL) 10 MG tablet Take 1 tablet (10 mg total) by mouth 2 (two) times daily as needed for muscle spasms., Starting Wed 11/29/2016, Print    predniSONE (DELTASONE) 10 MG tablet Take 2 tablets (20 mg total) by mouth daily., Starting Wed 11/29/2016, Print       I personally performed the services described in this documentation, which was scribed in my presence. The recorded information has been reviewed and is accurate.     Volanda Napoleon, PA-C  11/29/16 0231    Davonna Belling, MD 11/29/16 [phone]

## 2016-11-28 NOTE — ED Triage Notes (Signed)
Pt arrives with c/o right leg pain. Pt states she has been seen by an orthopedist, UC , and her PCP for back pain. Pt states she has had a MRI and has received injections. Pt states her pain has always been only in her lower back, but now it radiates down the right leg.

## 2016-11-28 NOTE — ED Notes (Addendum)
Severe pain in R groin area with burning pain that goes down the back of leg.  Numbness started today.

## 2016-11-28 NOTE — ED Notes (Signed)
Patient transported to X-ray 

## 2016-11-29 MED ORDER — PREDNISONE 10 MG PO TABS: 20.0000 mg | ORAL_TABLET | Freq: Every day | ORAL | 0 refills | Status: DC

## 2016-11-29 MED ORDER — CYCLOBENZAPRINE HCL 10 MG PO TABS: 10.0000 mg | ORAL_TABLET | Freq: Two times a day (BID) | ORAL | 0 refills | Status: DC | PRN

## 2016-11-29 MED ORDER — DEXAMETHASONE SODIUM PHOSPHATE 10 MG/ML IJ SOLN
10.0000 mg | Freq: Once | INTRAMUSCULAR | Status: AC
  Administered 2016-11-29: 10 mg via INTRAMUSCULAR
  Filled 2016-11-29: qty 1

## 2016-11-29 NOTE — Discharge Instructions (Signed)
Follow-up your primary care doctor in the next 24-48 hours for further evaluation   Take prednisone as directed.   Take Flexeril as prescribed. This medication will make you drowsy so do not drive or drink alcohol when taking it.  Return to the Emergency Department for any worsening pain, fever, difficulty walking, urinary or bowel accidents, or any other worsening or concerning symptoms.

## 2016-11-29 NOTE — Progress Notes (Signed)
Orthopedic Tech Progress Note Patient Details:  Ashley Steele [birth year] [id number]  Ortho Devices Type of Ortho Device: Ace wrap Ortho Device/Splint Location: rle. thigh ace wrap Ortho Device/Splint Interventions: Ordered, Application, Adjustment   Karolee Stamps 11/29/2016, 12:54 AM

## 2016-12-18 ENCOUNTER — Other Ambulatory Visit: Payer: Self-pay | Admitting: Family Medicine

## 2016-12-18 ENCOUNTER — Ambulatory Visit
Admission: RE | Admit: 2016-12-18 | Discharge: 2016-12-18 | Disposition: A | Discharge status: Home / Self Care | Payer: BLUE CROSS/BLUE SHIELD | Source: Ambulatory Visit | Attending: Family Medicine | Admitting: Family Medicine

## 2016-12-18 DIAGNOSIS — M25551 Pain in right hip: Secondary | ICD-10-CM

## 2016-12-19 ENCOUNTER — Other Ambulatory Visit: Payer: Self-pay | Admitting: Family Medicine

## 2016-12-19 DIAGNOSIS — M5431 Sciatica, right side: Secondary | ICD-10-CM

## 2016-12-30 ENCOUNTER — Ambulatory Visit
Admission: RE | Admit: 2016-12-30 | Discharge: 2016-12-30 | Disposition: A | Discharge status: Home / Self Care | Payer: BLUE CROSS/BLUE SHIELD | Source: Ambulatory Visit | Attending: Family Medicine | Admitting: Family Medicine

## 2016-12-30 DIAGNOSIS — M5431 Sciatica, right side: Secondary | ICD-10-CM

## 2017-01-25 ENCOUNTER — Encounter: Payer: BLUE CROSS/BLUE SHIELD | Admitting: Obstetrics & Gynecology

## 2017-02-19 NOTE — Progress Notes (Signed)
[facility] [place], [place] [address] Phone: [phone] Subjective:    I'm seeing this patient by the request  of:    CC: Right leg pain  JQB:[id number]  Ashley Steele is a 41 y.o. female coming in with complaint of right leg pain. She has been having pain since May. She has seen multiple providers who have not been able to diagnose her pain. She has had xray, EMG, MRI and was spoken to about lumbar fusion L5 on right side. The pain starts in her medial right thigh and travels down to the foot. She is in constant pain and only gets relief with IBU. She stands at her job all day.   Onset- months ago Location-right thigh Duration- constant Character- sharp Aggravating factors-patient states that movement or even sometimes sitting can cause a sharp pain on the medial aspect the leg and radiating towards her knee. Sometimes can radiate all the way down to her foot. Reliving factors- IBU Therapies tried-  Severity-   has had an MRI of her back done 12/30/2016. This was independently visualized by me. Patient does have a L4-L5 disc protrusion with lateral recess stenosis.  Hip x-rays 2 weeks before this were also visualized by me showing no significant bony abnormality same with the femur. Past Medical History:  Diagnosis Date  . Anxiety   . Hypertension    No past surgical history on file. Social History   Social History  . Marital status: Single    Spouse name: N/A  . Number of children: N/A  . Years of education: N/A   Social History Main Topics  . Smoking status: Never Smoker  . Smokeless tobacco: Never Used  . Alcohol use 0.0 oz/week     Comment: occassionally  . Drug use: No  . Sexual activity: Yes    Birth control/ protection: Pill     Comment: intercourse age 60, sexual partners less than 5   Other Topics Concern  . None   Social History Narrative  . None   No Known Allergies Family History  Problem Relation Age of  Onset  . Other Mother        MVA  . Hypertension Father   . Dementia Maternal Grandmother      Past medical history, social, surgical and family history all reviewed in electronic medical record.  No pertanent information unless stated regarding to the chief complaint.   Review of Systems:Review of systems updated and as accurate as of 02/20/17  No headache, visual changes, nausea, vomiting, diarrhea, constipation, dizziness, abdominal pain, skin rash, fevers, chills, night sweats, weight loss, swollen lymph nodes, body aches, joint swelling, chest pain, shortness of breath, mood changes. Positive muscle aches  Objective  Blood pressure (!) 132/100, pulse (!) 58, height 5\' 4"  (1.626 m), weight 203 lb (92.1 kg), SpO2 (!) 85 %. Systems examined below as of 02/20/17   General: No apparent distress alert and oriented x3 mood and affect normal, dressed appropriately.  HEENT: Pupils equal, extraocular movements intact  Respiratory: Patient's speak in full sentences and does not appear short of breath  Cardiovascular: No lower extremity edema, non tender, no erythema  Skin: Warm dry intact with no signs of infection or rash on extremities or on axial skeleton.  Abdomen: Soft nontender  Neuro: Cranial nerves II through XII are intact, neurovascularly intact in all extremities with 2+ DTRs and 2+ pulses.  Lymph: No lymphadenopathy of posterior or anterior cervical chain or  axillae bilaterally.  Gait Antalgic gait  MSK:  Non tender with full range of motion and good stability and symmetric strength and tone of shoulders, elbows, wrist, hip, knee and ankles bilaterally.  On exam patient is severely tender to palpation in a very localized area of the right femur more medial. No mass truly appreciated but does have a fullness felt in this area. No aneurysm noted. Seems to be within the muscle belly itself. Negative straight leg test today but does have some discomfort in the back as well. No worsening  pain with internal or external rotation of the hip. No worsening pain with resisted adduction of the hip.  Limited musculoskeletal ultrasound was performed and interpreted by Lyndal Pulley  Limited ultrasound the patient's right femur does show that patient does have a vascular abnormality in the area of the pain. Seems to be possibly intramuscular. Difficult to assess the surrounding area. No true muscle injury noted though. No true mass noted either but patient does have somewhat of a circular change in the vascularity in the area.   Impression and Recommendations:     This case required medical decision making of moderate complexity.      Note: This dictation was prepared with Dragon dictation along with smaller phrase technology. Any transcriptional errors that result from this process are unintentional.

## 2017-02-20 ENCOUNTER — Ambulatory Visit (INDEPENDENT_AMBULATORY_CARE_PROVIDER_SITE_OTHER): Payer: BLUE CROSS/BLUE SHIELD | Admitting: Family Medicine

## 2017-02-20 ENCOUNTER — Ambulatory Visit: Payer: Self-pay

## 2017-02-20 ENCOUNTER — Encounter: Payer: Self-pay | Admitting: Family Medicine

## 2017-02-20 VITALS — BP 132/100 | HR 58 | Ht 64.0 in | Wt 203.0 lb

## 2017-02-20 DIAGNOSIS — M79604 Pain in right leg: Secondary | ICD-10-CM | POA: Diagnosis not present

## 2017-02-20 DIAGNOSIS — M79651 Pain in right thigh: Secondary | ICD-10-CM

## 2017-02-20 MED ORDER — CILOSTAZOL 50 MG PO TABS: 50.0000 mg | ORAL_TABLET | Freq: Two times a day (BID) | ORAL | 1 refills | Status: DC

## 2017-02-20 NOTE — Assessment & Plan Note (Addendum)
Patient's pain is very localized. Seems to hurt her more with weightbearing but can also hurt her but palpitations. Patient does have some pain with fulcrum testing. Differential includes think possible lumbar radiculopathy but with an L5 nerve root injection should be more on the lateral aspect of the hip and a positive straight leg test. We will continue to monitor that and consider that as a possibility for diagnostic and therapeutic purposes such as an epidural. Not responding to the gabapentin. Vascular compromise is a possibility that there is no coldness to touch of the lower extremity. Pletal started to see if this will be beneficial and warned of potential side effects. Patient's pain is concerning also for potential for an occult fracture such as a stress reaction. X-rays were unremarkable but I do feel advance imaging is warranted. With the vascular abnormality noted on ultrasound I do also feel that the 8 advance imaging could be beneficial to make sure that we are not missing an early sarcoma of some sort. This is lower on the differential. Nerve impingement is also within differential. No sign of any type of hernia the femoral hernia is also possible. Depending on imaging this could change medical management. Patient will try some compression, we will get a vascular Doppler done to rule out any type of superficial or deep clot but also in this area would be more unremarkable. We discussed the possibility of laboratory workup but we'll start with this first. Patient knows if worsening symptoms to seek medical attention immediately.

## 2017-02-20 NOTE — Patient Instructions (Addendum)
Good to see you  Ashley Steele is your friend.  Compression sleeve could help daily  We will get doppler and MRI of the femur.  pletal 2 times a day  We will get tests and see how we do.

## 2017-02-21 ENCOUNTER — Ambulatory Visit (HOSPITAL_COMMUNITY)
Admission: RE | Admit: 2017-02-21 | Discharge: 2017-02-21 | Disposition: A | Discharge status: Home / Self Care | Payer: BLUE CROSS/BLUE SHIELD | Source: Ambulatory Visit | Attending: Cardiovascular Disease | Admitting: Cardiovascular Disease

## 2017-02-21 DIAGNOSIS — M79604 Pain in right leg: Secondary | ICD-10-CM | POA: Diagnosis not present

## 2017-02-21 DIAGNOSIS — M79609 Pain in unspecified limb: Secondary | ICD-10-CM | POA: Diagnosis not present

## 2017-02-23 ENCOUNTER — Ambulatory Visit (INDEPENDENT_AMBULATORY_CARE_PROVIDER_SITE_OTHER): Payer: BLUE CROSS/BLUE SHIELD | Admitting: Obstetrics & Gynecology

## 2017-02-23 ENCOUNTER — Encounter: Payer: Self-pay | Admitting: Obstetrics & Gynecology

## 2017-02-23 VITALS — BP 130/86 | Ht 64.0 in | Wt 205.0 lb

## 2017-02-23 DIAGNOSIS — Z3041 Encounter for surveillance of contraceptive pills: Secondary | ICD-10-CM

## 2017-02-23 DIAGNOSIS — E66812 Obesity, class 2: Secondary | ICD-10-CM

## 2017-02-23 DIAGNOSIS — N763 Subacute and chronic vulvitis: Secondary | ICD-10-CM | POA: Diagnosis not present

## 2017-02-23 DIAGNOSIS — Z6835 Body mass index (BMI) 35.0-35.9, adult: Secondary | ICD-10-CM

## 2017-02-23 DIAGNOSIS — B9689 Other specified bacterial agents as the cause of diseases classified elsewhere: Secondary | ICD-10-CM

## 2017-02-23 DIAGNOSIS — N760 Acute vaginitis: Secondary | ICD-10-CM

## 2017-02-23 DIAGNOSIS — E6609 Other obesity due to excess calories: Secondary | ICD-10-CM

## 2017-02-23 DIAGNOSIS — N898 Other specified noninflammatory disorders of vagina: Secondary | ICD-10-CM

## 2017-02-23 DIAGNOSIS — Z01411 Encounter for gynecological examination (general) (routine) with abnormal findings: Secondary | ICD-10-CM | POA: Diagnosis not present

## 2017-02-23 MED ORDER — CLOBETASOL PROPIONATE 0.05 % EX OINT: 1.0000 "application " | TOPICAL_OINTMENT | Freq: Every day | CUTANEOUS | 1 refills | Status: DC

## 2017-02-23 MED ORDER — TINIDAZOLE 500 MG PO TABS
2.0000 g | ORAL_TABLET | Freq: Every day | ORAL | 0 refills | Status: DC
Start: 2017-02-23 — End: 2017-05-09

## 2017-02-23 MED ORDER — FLUCONAZOLE 150 MG PO TABS: 150.0000 mg | ORAL_TABLET | Freq: Once | ORAL | 0 refills | Status: AC

## 2017-02-23 MED ORDER — NORETHINDRONE ACET-ETHINYL EST 1-20 MG-MCG PO TABS: ORAL_TABLET | ORAL | 4 refills | Status: DC

## 2017-02-23 NOTE — Progress Notes (Signed)
Ashley Steele [birth year] [id number]   History:    41 y.o.  G2P1A1L1 Boyfriend x 3 years.  Daughter 26 yo doing well.  RP:  Established patient presenting for annual gyn exam   HPI:  Well on Microgestin 1/20.  Periods mode Occasional Rt pelvic pain, but no current pain.  Know Uterine Fibroids.  C/O vulvar itching on off since 08/2016.  Treated for Yeast vaginitis 08/2016 and Bacterial Vaginosis 11/2016.  Irritation involves vulva and inguinal areas bilaterally.  Mild increase in vaginal d/c.  No UTI Sx.  BMs wnl.  Breasts wnl.  Past medical history,surgical history, family history and social history were all reviewed and documented in the EPIC chart.  Gynecologic History Patient's last menstrual period was 02/16/2017. Contraception: OCP (estrogen/progesterone) Last Pap: 2015. Results were: normal Last mammogram: Never.  Needs to schedule.  Obstetric History OB History  Gravida Para Term Preterm AB Living  2 1     1 1   SAB TAB Ectopic Multiple Live Births    1          # Outcome Date GA Lbr Len/2nd Weight Sex Delivery Anes PTL Lv  2 TAB           1 Para                ROS: A ROS was performed and pertinent positives and negatives are included in the history.  GENERAL: No fevers or chills. HEENT: No change in vision, no earache, sore throat or sinus congestion. NECK: No pain or stiffness. CARDIOVASCULAR: No chest pain or pressure. No palpitations. PULMONARY: No shortness of breath, cough or wheeze. GASTROINTESTINAL: No abdominal pain, nausea, vomiting or diarrhea, melena or bright red blood per rectum. GENITOURINARY: No urinary frequency, urgency, hesitancy or dysuria. MUSCULOSKELETAL: No joint or muscle pain, no back pain, no recent trauma. DERMATOLOGIC: No rash, no itching, no lesions. ENDOCRINE: No polyuria, polydipsia, no heat or cold intolerance. No recent change in weight. HEMATOLOGICAL: No anemia or easy bruising or bleeding. NEUROLOGIC: No headache, seizures, numbness, tingling  or weakness. PSYCHIATRIC: No depression, no loss of interest in normal activity or change in sleep pattern.     Exam:   BP 130/86   Ht 5\' 4"  (1.626 m)   Wt 205 lb (93 kg)   LMP 02/16/2017 Comment: JUNEL  BMI 35.19 kg/m   Body mass index is 35.19 kg/m.  General appearance : Well developed well nourished female. No acute distress HEENT: Eyes: no retinal hemorrhage or exudates,  Neck supple, trachea midline, no carotid bruits, no thyroidmegaly Lungs: Clear to auscultation, no rhonchi or wheezes, or rib retractions  Heart: Regular rate and rhythm, no murmurs or gallops Breast:Examined in sitting and supine position were symmetrical in appearance, no palpable masses or tenderness,  no skin retraction, no nipple inversion, no nipple discharge, no skin discoloration, no axillary or supraclavicular lymphadenopathy Abdomen: no palpable masses or tenderness, no rebound or guarding Extremities: no edema or skin discoloration or tenderness  Pelvic: Vulva:  Mild erythema at vulva bilaterally and in inguinal areas.  No discrete lesion.  Bartholin, Urethra, Skene Glands: Within normal limits             Vagina: No gross lesions or discharge  Cervix: No gross lesions or discharge.  Pap/HPV HR done.  Uterus  AV, mildly increase size and irregular c/w fibroids, non-tender and mobile  Adnexa  Without masses or tenderness  Anus and perineum  normal    Assessment/Plan:  41 y.o.  female for annual exam   1. Encounter for gynecological examination with abnormal finding Gyn exam with known ASxic Uterine Fibroids, Chronic Vulvitis and Bacterial Vaginosis.  Pap/HPV HR done.  Breasts wnl.  Will schedule Screening Mammo.  2. Encounter for surveillance of contraceptive pills Well on Junel 1/20, represcribed.  3. Vaginal discharge Bacterial Vaginosis.  Treat with Tinidazole.  Usage reviewed.  Fluconazole for Yeast Vaginitis post ABTx.  Probiotic 1 tab vaginally every week as needed for prevention. - WET  PREP FOR [place], YEAST, CLUE  4. Bacterial vaginosis As above.  5. Chronic vulvitis Will treat with Clobetasol 0.05% ointment.  Slow weaning to avoid recurrence.  6. Class 2 obesity due to excess calories without serious comorbidity with body mass index (BMI) of 35.0 to 35.9 in adult Low Calorie/Carb diet and regular physical activity recommended.  Counseling on above issues >50% x 10 minutes.  Princess Bruins MD, 4:17 PM 02/23/2017

## 2017-02-25 NOTE — Patient Instructions (Signed)
1. Encounter for gynecological examination with abnormal finding Gyn exam with known ASxic Uterine Fibroids, Chronic Vulvitis and Bacterial Vaginosis.  Pap/HPV HR done.  Breasts wnl.  Will schedule Screening Mammo.  2. Encounter for surveillance of contraceptive pills Well on Junel 1/20, represcribed.  3. Vaginal discharge Bacterial Vaginosis.  Treat with Tinidazole.  Usage reviewed.  Fluconazole for Yeast Vaginitis post ABTx.  Probiotic 1 tab vaginally every week as needed for prevention. - WET PREP FOR Ashley Steele, YEAST, CLUE  4. Bacterial vaginosis As above.  5. Chronic vulvitis Will treat with Clobetasol 0.05% ointment.  Slow weaning to avoid recurrence.  6. Class 2 obesity due to excess calories without serious comorbidity with body mass index (BMI) of 35.0 to 35.9 in adult Low Calorie/Carb diet and regular physical activity recommended.  Ashley Steele, it was a pleasure to see you today!  I will inform you of your results as soon as available.  Health Maintenance, Female Adopting a healthy lifestyle and getting preventive care can go a long way to promote health and wellness. Talk with your health care provider about what schedule of regular examinations is right for you. This is a good chance for you to check in with your provider about disease prevention and staying healthy. In between checkups, there are plenty of things you can do on your own. Experts have done a lot of research about which lifestyle changes and preventive measures are most likely to keep you healthy. Ask your health care provider for more information. Weight and diet Eat a healthy diet  Be sure to include plenty of vegetables, fruits, low-fat dairy products, and lean protein.  Do not eat a lot of foods high in solid fats, added sugars, or salt.  Get regular exercise. This is one of the most important things you can do for your health. ? Most adults should exercise for at least 150 minutes each week. The exercise should  increase your heart rate and make you sweat (moderate-intensity exercise). ? Most adults should also do strengthening exercises at least twice a week. This is in addition to the moderate-intensity exercise.  Maintain a healthy weight  Body mass index (BMI) is a measurement that can be used to identify possible weight problems. It estimates body fat based on height and weight. Your health care provider can help determine your BMI and help you achieve or maintain a healthy weight.  For females 61 years of age and older: ? A BMI below 18.5 is considered underweight. ? A BMI of 18.5 to 24.9 is normal. ? A BMI of 25 to 29.9 is considered overweight. ? A BMI of 30 and above is considered obese.  Watch levels of cholesterol and blood lipids  You should start having your blood tested for lipids and cholesterol at 40 years of age, then have this test every 5 years.  You may need to have your cholesterol levels checked more often if: ? Your lipid or cholesterol levels are high. ? You are older than 40 years of age. ? You are at high risk for heart disease.  Cancer screening Lung Cancer  Lung cancer screening is recommended for adults 85-1 years old who are at high risk for lung cancer because of a history of smoking.  A yearly low-dose CT scan of the lungs is recommended for people who: ? Currently smoke. ? Have quit within the past 15 years. ? Have at least a 30-pack-year history of smoking. A pack year is smoking an average of one  pack of cigarettes a day for 1 year.  Yearly screening should continue until it has been 15 years since you quit.  Yearly screening should stop if you develop a health problem that would prevent you from having lung cancer treatment.  Breast Cancer  Practice breast self-awareness. This means understanding how your breasts normally appear and feel.  It also means doing regular breast self-exams. Let your health care provider know about any changes, no matter  how small.  If you are in your 20s or 30s, you should have a clinical breast exam (CBE) by a health care provider every 1-3 years as part of a regular health exam.  If you are 34 or older, have a CBE every year. Also consider having a breast X-ray (mammogram) every year.  If you have a family history of breast cancer, talk to your health care provider about genetic screening.  If you are at high risk for breast cancer, talk to your health care provider about having an MRI and a mammogram every year.  Breast cancer gene (BRCA) assessment is recommended for women who have family members with BRCA-related cancers. BRCA-related cancers include: ? Breast. ? Ovarian. ? Tubal. ? Peritoneal cancers.  Results of the assessment will determine the need for genetic counseling and BRCA1 and BRCA2 testing.  Cervical Cancer Your health care provider may recommend that you be screened regularly for cancer of the pelvic organs (ovaries, uterus, and vagina). This screening involves a pelvic examination, including checking for microscopic changes to the surface of your cervix (Pap test). You may be encouraged to have this screening done every 3 years, beginning at age 25.  For women ages 86-65, health care providers may recommend pelvic exams and Pap testing every 3 years, or they may recommend the Pap and pelvic exam, combined with testing for human papilloma virus (HPV), every 5 years. Some types of HPV increase your risk of cervical cancer. Testing for HPV may also be done on women of any age with unclear Pap test results.  Other health care providers may not recommend any screening for nonpregnant women who are considered low risk for pelvic cancer and who do not have symptoms. Ask your health care provider if a screening pelvic exam is right for you.  If you have had past treatment for cervical cancer or a condition that could lead to cancer, you need Pap tests and screening for cancer for at least 20  years after your treatment. If Pap tests have been discontinued, your risk factors (such as having a new sexual partner) need to be reassessed to determine if screening should resume. Some women have medical problems that increase the chance of getting cervical cancer. In these cases, your health care provider may recommend more frequent screening and Pap tests.  Colorectal Cancer  This type of cancer can be detected and often prevented.  Routine colorectal cancer screening usually begins at 40 years of age and continues through 40 years of age.  Your health care provider may recommend screening at an earlier age if you have risk factors for colon cancer.  Your health care provider may also recommend using home test kits to check for hidden blood in the stool.  A small camera at the end of a tube can be used to examine your colon directly (sigmoidoscopy or colonoscopy). This is done to check for the earliest forms of colorectal cancer.  Routine screening usually begins at age 35.  Direct examination of the colon should be  repeated every 5-10 years through 40 years of age. However, you may need to be screened more often if early forms of precancerous polyps or small growths are found.  Skin Cancer  Check your skin from head to toe regularly.  Tell your health care provider about any new moles or changes in moles, especially if there is a change in a mole's shape or color.  Also tell your health care provider if you have a mole that is larger than the size of a pencil eraser.  Always use sunscreen. Apply sunscreen liberally and repeatedly throughout the day.  Protect yourself by wearing long sleeves, pants, a wide-brimmed hat, and sunglasses whenever you are outside.  Heart disease, diabetes, and high blood pressure  High blood pressure causes heart disease and increases the risk of stroke. High blood pressure is more likely to develop in: ? People who have blood pressure in the high  end of the normal range (130-139/85-89 mm Hg). ? People who are overweight or obese. ? People who are African American.  If you are 73-27 years of age, have your blood pressure checked every 3-5 years. If you are 1 years of age or older, have your blood pressure checked every year. You should have your blood pressure measured twice-once when you are at a hospital or clinic, and once when you are not at a hospital or clinic. Record the average of the two measurements. To check your blood pressure when you are not at a hospital or clinic, you can use: ? An automated blood pressure machine at a pharmacy. ? A home blood pressure monitor.  If you are between 23 years and 21 years old, ask your health care provider if you should take aspirin to prevent strokes.  Have regular diabetes screenings. This involves taking a blood sample to check your fasting blood sugar level. ? If you are at a normal weight and have a low risk for diabetes, have this test once every three years after 40 years of age. ? If you are overweight and have a high risk for diabetes, consider being tested at a younger age or more often. Preventing infection Hepatitis B  If you have a higher risk for hepatitis B, you should be screened for this virus. You are considered at high risk for hepatitis B if: ? You were born in a country where hepatitis B is common. Ask your health care provider which countries are considered high risk. ? Your parents were born in a high-risk country, and you have not been immunized against hepatitis B (hepatitis B vaccine). ? You have HIV or AIDS. ? You use needles to inject street drugs. ? You live with someone who has hepatitis B. ? You have had sex with someone who has hepatitis B. ? You get hemodialysis treatment. ? You take certain medicines for conditions, including cancer, organ transplantation, and autoimmune conditions.  Hepatitis C  Blood testing is recommended for: ? Everyone born from  [birth year] through 1964. ? Anyone with known risk factors for hepatitis C.  Sexually transmitted infections (STIs)  You should be screened for sexually transmitted infections (STIs) including gonorrhea and chlamydia if: ? You are sexually active and are younger than 40 years of age. ? You are older than 40 years of age and your health care provider tells you that you are at risk for this type of infection. ? Your sexual activity has changed since you were last screened and you are at an increased risk for  chlamydia or gonorrhea. Ask your health care provider if you are at risk.  If you do not have HIV, but are at risk, it may be recommended that you take a prescription medicine daily to prevent HIV infection. This is called pre-exposure prophylaxis (PrEP). You are considered at risk if: ? You are sexually active and do not regularly use condoms or know the HIV status of your partner(s). ? You take drugs by injection. ? You are sexually active with a partner who has HIV.  Talk with your health care provider about whether you are at high risk of being infected with HIV. If you choose to begin PrEP, you should first be tested for HIV. You should then be tested every 3 months for as long as you are taking PrEP. Pregnancy  If you are premenopausal and you may become pregnant, ask your health care provider about preconception counseling.  If you may become pregnant, take 400 to 800 micrograms (mcg) of folic acid every day.  If you want to prevent pregnancy, talk to your health care provider about birth control (contraception). Osteoporosis and menopause  Osteoporosis is a disease in which the bones lose minerals and strength with aging. This can result in serious bone fractures. Your risk for osteoporosis can be identified using a bone density scan.  If you are 37 years of age or older, or if you are at risk for osteoporosis and fractures, ask your health care provider if you should be  screened.  Ask your health care provider whether you should take a calcium or vitamin D supplement to lower your risk for osteoporosis.  Menopause may have certain physical symptoms and risks.  Hormone replacement therapy may reduce some of these symptoms and risks. Talk to your health care provider about whether hormone replacement therapy is right for you. Follow these instructions at home:  Schedule regular health, dental, and eye exams.  Stay current with your immunizations.  Do not use any tobacco products including cigarettes, chewing tobacco, or electronic cigarettes.  If you are pregnant, do not drink alcohol.  If you are breastfeeding, limit how much and how often you drink alcohol.  Limit alcohol intake to no more than 1 drink per day for nonpregnant women. One drink equals 12 ounces of beer, 5 ounces of wine, or 1 ounces of hard liquor.  Do not use street drugs.  Do not share needles.  Ask your health care provider for help if you need support or information about quitting drugs.  Tell your health care provider if you often feel depressed.  Tell your health care provider if you have ever been abused or do not feel safe at home. This information is not intended to replace advice given to you by your health care provider. Make sure you discuss any questions you have with your health care provider. Document Released: 12/05/2010 Document Revised: 10/28/2015 Document Reviewed: 02/23/2015 Elsevier Interactive Patient Education  [employer].

## 2017-02-26 LAB — WET PREP FOR TRICH, YEAST, CLUE

## 2017-02-27 LAB — PAP IG AND HPV HIGH-RISK: HPV DNA High Risk: NOT DETECTED

## 2017-03-08 ENCOUNTER — Telehealth: Payer: Self-pay | Admitting: *Deleted

## 2017-03-08 MED ORDER — NORETHINDRONE ACET-ETHINYL EST 1-20 MG-MCG PO TABS: ORAL_TABLET | ORAL | 4 refills | Status: DC

## 2017-03-08 NOTE — Telephone Encounter (Signed)
Pt called requesting her birth control pills sent to a new pharmacy, Rx sent to [facility] on [address] per request.

## 2017-03-10 ENCOUNTER — Inpatient Hospital Stay: Admission: RE | Admit: 2017-03-10 | Payer: BLUE CROSS/BLUE SHIELD | Source: Ambulatory Visit

## 2017-04-03 ENCOUNTER — Ambulatory Visit
Admission: RE | Admit: 2017-04-03 | Discharge: 2017-04-03 | Disposition: A | Discharge status: Home / Self Care | Payer: BLUE CROSS/BLUE SHIELD | Source: Ambulatory Visit | Attending: Family Medicine | Admitting: Family Medicine

## 2017-04-03 DIAGNOSIS — M79604 Pain in right leg: Secondary | ICD-10-CM

## 2017-04-03 MED ORDER — GADOBENATE DIMEGLUMINE 529 MG/ML IV SOLN: 20.0000 mL | Freq: Once | INTRAVENOUS | Status: DC | PRN

## 2017-04-03 MED ORDER — GADOBENATE DIMEGLUMINE 529 MG/ML IV SOLN
19.0000 mL | Freq: Once | INTRAVENOUS | Status: AC | PRN
  Administered 2017-04-03: 19 mL via INTRAVENOUS

## 2017-04-11 ENCOUNTER — Telehealth: Payer: Self-pay | Admitting: *Deleted

## 2017-04-11 NOTE — Telephone Encounter (Signed)
Patient called to follow up from [place] on 02/23/17, states clobetasol cream is helping with vulvitis, states it has made worse, states now looks like a burn. Pt asked what to do next? Please advise

## 2017-04-13 NOTE — Telephone Encounter (Signed)
Please schedule visit with me to evaluate.  In meantime soak in clean warm water baths.  Softly pat to dry vulva.  Avoid all chemicals.

## 2017-04-16 ENCOUNTER — Encounter: Payer: Self-pay | Admitting: *Deleted

## 2017-04-16 NOTE — Telephone Encounter (Signed)
Pt voicemail not set up to leave a message. My chart message sent.

## 2017-04-18 ENCOUNTER — Encounter: Payer: Self-pay | Admitting: Family Medicine

## 2017-04-18 ENCOUNTER — Ambulatory Visit: Payer: BLUE CROSS/BLUE SHIELD | Admitting: Family Medicine

## 2017-04-18 VITALS — BP 130/90 | HR 99 | Ht 64.0 in | Wt 208.0 lb

## 2017-04-18 DIAGNOSIS — M79604 Pain in right leg: Secondary | ICD-10-CM

## 2017-04-18 DIAGNOSIS — M87051 Idiopathic aseptic necrosis of right femur: Secondary | ICD-10-CM

## 2017-04-18 NOTE — Progress Notes (Signed)
[facility] [address] [place], [place] [address] Phone: [phone] Subjective:    I'm seeing this patient by the request  of:    CC: Leg pain follow-up  SEG:[id number]  Ashley Steele is a 41 y.o. female coming in with complaint of right leg pain.  Patient was found to have severe amount of right thigh pain.  Concern for potential stress reaction and sent for an MRI.  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  Patient is here to discuss other treatment options.      Past Medical History:  Diagnosis Date  . Anxiety   . Hypertension    No past surgical history on file. Social History   Socioeconomic History  . Marital status: Single    Spouse name: None  . Number of children: None  . Years of education: None  . Highest education level: None  Social Needs  . Financial resource strain: None  . Food insecurity - worry: None  . Food insecurity - inability: None  . Transportation needs - medical: None  . Transportation needs - non-medical: None  Occupational History  . None  Tobacco Use  . Smoking status: Never Smoker  . Smokeless tobacco: Never Used  Substance and Sexual Activity  . Alcohol use: Yes    Alcohol/week: 0.0 oz    Comment: occassionally  . Drug use: No  . Sexual activity: Yes    Birth control/protection: Pill    Comment: intercourse age 26, sexual partners less than 65, current partner- 3 yrs   Other Topics Concern  . None  Social History Narrative  . None   No Known Allergies Family History  Problem Relation Age of Onset  . Other Mother        MVA  . Hypertension Father   . Dementia Maternal Grandmother      Past medical history, social, surgical and family history all reviewed in electronic medical record.  No pertanent information unless stated regarding to the chief complaint.   Review of Systems:Review of systems updated and as accurate as of 04/18/17  No headache, visual  changes, nausea, vomiting, diarrhea, constipation, dizziness, abdominal pain, skin rash, fevers, chills, night sweats, weight loss, swollen lymph nodes, body aches, joint swelling, muscle aches, chest pain, shortness of breath, mood changes.   Objective  Blood pressure 130/90, pulse 99, height 5\' 4"  (1.626 m), weight 208 lb (94.3 kg), SpO2 92 %. Systems examined below as of 04/18/17   General: No apparent distress alert and oriented x3 mood and affect normal, dressed appropriately.  HEENT: Pupils equal, extraocular movements intact  Respiratory: Patient's speak in full sentences and does not appear short of breath  Cardiovascular: No lower extremity edema, non tender, no erythema  Skin: Warm dry intact with no signs of infection or rash on extremities or on axial skeleton.  Abdomen: Soft nontender  Neuro: Cranial nerves II through XII are intact, neurovascularly intact in all extremities with 2+ DTRs and 2+ pulses.  Lymph: No lymphadenopathy of posterior or anterior cervical chain or axillae bilaterally.  Gait normal with good balance and coordination.  MSK:  Non tender with full range of motion and good stability and symmetric strength and tone of shoulders, elbows, wrist,  knee and ankles bilaterally.  Right hip exam shows the patient has minimal internal rotation at this time.  Positive grind test.  Negative straight leg test but tight muscles.  Patient has  limited range of motion even with external rotation.  This is worse than previous exam.  Continued positive fulcrum test Contralateral hip does have some pain with internal rotation as well.   Impression and Recommendations:     This case required medical decision making of moderate complexity.      Note: This dictation was prepared with Dragon dictation along with smaller phrase technology. Any transcriptional errors that result from this process are unintentional.

## 2017-04-18 NOTE — Patient Instructions (Signed)
Good to see you  Alvera Singh is your friend.  Dr. Mayer Camel or Dr. Berenice Primas would be great and we will refer you  They will call you  If you have any questions give me a call  Happy holidays!

## 2017-04-18 NOTE — Assessment & Plan Note (Signed)
Spent  25 minutes with patient face-to-face and had greater than 50% of counseling including as described in assessment and plan.  We went over the anatomy, went over the MRI we discussed the possibility of treating with vitamin D and bisphosphonates but due to the severity I do feel that possible surgical intervention would be more beneficial.  Patient is only 41 years old.  Unfortunately looking at the MRI the contralateral side will also likely give her some discomfort and pain at some point.  We discussed icing regimen, patient will try to do some other potential lower impact exercises but will be referred to orthopedic surgery for the possibility of joint replacement.  Patient is in agreement with the plan.

## 2017-05-05 HISTORY — PX: TOTAL HIP ARTHROPLASTY: SHX124

## 2017-05-09 ENCOUNTER — Ambulatory Visit: Payer: BLUE CROSS/BLUE SHIELD | Admitting: Obstetrics & Gynecology

## 2017-05-09 ENCOUNTER — Encounter: Payer: Self-pay | Admitting: Obstetrics & Gynecology

## 2017-05-09 VITALS — BP 126/80 | Ht 64.0 in | Wt 210.0 lb

## 2017-05-09 DIAGNOSIS — R21 Rash and other nonspecific skin eruption: Secondary | ICD-10-CM

## 2017-05-09 LAB — WET PREP FOR TRICH, YEAST, CLUE

## 2017-05-09 MED ORDER — NYSTATIN 100000 UNIT/GM EX POWD: Freq: Two times a day (BID) | CUTANEOUS | 1 refills | Status: DC

## 2017-05-09 NOTE — Progress Notes (Signed)
    Ashley Steele 05-02-1976 [id number]        41 y.o.  [id number]   RP: Rash in the inner thigh area bilaterally for a few days  HPI: Patient feels irritated and has some itching with redness at the upper inner thighs bilaterally where the skin is rubbing with movements and at the groin area bilaterally.  No abnormal vaginal discharge.  No vaginal itching.  No pelvic pain.  Urine normal.  Bowel movements normal.  No fever.  Body mass index 36.05.  At the time of the annual exam February 23, 2017 patient was complaining of vulvar itching on off since 08/2016. Treated for Yeast vaginitis 08/2016 and Bacterial Vaginosis 11/2016.  Irritation involves vulva and inguinal areas bilaterally. Mild increase in vaginal d/c.  At that time her wet prep showed bacterial vaginosis and she was treated with tinidazole followed by fluconazole for protection post antibiotics.    Past medical history,surgical history, problem list, medications, allergies, family history and social history were all reviewed and documented in the EPIC chart.  Directed ROS with pertinent positives and negatives documented in the history of present illness/assessment and plan.  Exam:  Vitals:   05/09/17 1410  BP: 126/80  Weight: 210 lb (95.3 kg)  Height: 5\' 4"  (1.626 m)   General appearance:  Normal  Gyn exam:  Vulvar and groin rash.  Vaginal wet prep done.   Wet prep today negative   Assessment/Plan:  41 y.o. G2P0011   1. Rash of groin Wet prep negative in vagina.  Decision to treat with a powder of nystatin for possible yeast infection of the skin.  Use perfume free soaps and rinse well with clear water.  Avoid rubbing or scratching.  Recommendations to keep the skin in those areas as dry as possible.  Dry by gentle padding.  2. Vulvar rash As above. - WET PREP FOR [place], YEAST, CLUE  Counseling on above issues >50% x 15 minutes.  Princess Bruins MD, 2:53 PM 05/09/2017

## 2017-05-13 ENCOUNTER — Encounter: Payer: Self-pay | Admitting: Obstetrics & Gynecology

## 2017-05-13 NOTE — Patient Instructions (Signed)
1. Rash of groin Wet prep negative in vagina.  Decision to treat with a powder of nystatin for possible yeast infection of the skin.  Use perfume free soaps and rinse well with clear water.  Avoid rubbing or scratching.  Recommendations to keep the skin in those areas as dry as possible.  Dry by gentle padding.  2. Vulvar rash As above. - WET PREP FOR Ashley Steele, YEAST, CLUE  [place], good seeing you today!

## 2018-04-09 ENCOUNTER — Encounter: Payer: BLUE CROSS/BLUE SHIELD | Admitting: Women's Health

## 2018-05-13 ENCOUNTER — Telehealth: Payer: Self-pay

## 2018-05-13 MED ORDER — NORETHINDRONE ACET-ETHINYL EST 1-20 MG-MCG PO TABS: ORAL_TABLET | ORAL | 0 refills | Status: DC

## 2018-05-13 NOTE — Telephone Encounter (Signed)
Patient out of BCP's and CE is scheduled 06/17/18.  Requesting refill. Rx sent.

## 2018-06-17 ENCOUNTER — Encounter: Payer: Self-pay | Admitting: Women's Health

## 2018-06-17 ENCOUNTER — Ambulatory Visit: Payer: BLUE CROSS/BLUE SHIELD | Admitting: Women's Health

## 2018-06-17 VITALS — BP 144/90 | Ht 63.5 in | Wt 214.0 lb

## 2018-06-17 DIAGNOSIS — Z01419 Encounter for gynecological examination (general) (routine) without abnormal findings: Secondary | ICD-10-CM | POA: Diagnosis not present

## 2018-06-17 MED ORDER — NORETHINDRONE 0.35 MG PO TABS: 1.0000 | ORAL_TABLET | Freq: Every day | ORAL | 4 refills | Status: DC

## 2018-06-17 NOTE — Patient Instructions (Signed)
Health Maintenance, Female Adopting a healthy lifestyle and getting preventive care can go a long way to promote health and wellness. Talk with your health care provider about what schedule of regular examinations is right for you. This is a good chance for you to check in with your provider about disease prevention and staying healthy. In between checkups, there are plenty of things you can do on your own. Experts have done a lot of research about which lifestyle changes and preventive measures are most likely to keep you healthy. Ask your health care provider for more information. Weight and diet Eat a healthy diet  Be sure to include plenty of vegetables, fruits, low-fat dairy products, and lean protein.  Do not eat a lot of foods high in solid fats, added sugars, or salt.  Get regular exercise. This is one of the most important things you can do for your health. ? Most adults should exercise for at least 150 minutes each week. The exercise should increase your heart rate and make you sweat (moderate-intensity exercise). ? Most adults should also do strengthening exercises at least twice a week. This is in addition to the moderate-intensity exercise. Maintain a healthy weight  Body mass index (BMI) is a measurement that can be used to identify possible weight problems. It estimates body fat based on height and weight. Your health care provider can help determine your BMI and help you achieve or maintain a healthy weight.  For females 20 years of age and older: ? A BMI below 18.5 is considered underweight. ? A BMI of 18.5 to 24.9 is normal. ? A BMI of 25 to 29.9 is considered overweight. ? A BMI of 30 and above is considered obese. Watch levels of cholesterol and blood lipids  You should start having your blood tested for lipids and cholesterol at 42 years of age, then have this test every 5 years.  You may need to have your cholesterol levels checked more often if: ? Your lipid or  cholesterol levels are high. ? You are older than 42 years of age. ? You are at high risk for heart disease. Cancer screening Lung Cancer  Lung cancer screening is recommended for adults 55-80 years old who are at high risk for lung cancer because of a history of smoking.  A yearly low-dose CT scan of the lungs is recommended for people who: ? Currently smoke. ? Have quit within the past 15 years. ? Have at least a 30-pack-year history of smoking. A pack year is smoking an average of one pack of cigarettes a day for 1 year.  Yearly screening should continue until it has been 15 years since you quit.  Yearly screening should stop if you develop a health problem that would prevent you from having lung cancer treatment. Breast Cancer  Practice breast self-awareness. This means understanding how your breasts normally appear and feel.  It also means doing regular breast self-exams. Let your health care provider know about any changes, no matter how small.  If you are in your 20s or 30s, you should have a clinical breast exam (CBE) by a health care provider every 1-3 years as part of a regular health exam.  If you are 40 or older, have a CBE every year. Also consider having a breast X-ray (mammogram) every year.  If you have a family history of breast cancer, talk to your health care provider about genetic screening.  If you are at high risk for breast cancer, talk   to your health care provider about having an MRI and a mammogram every year.  Breast cancer gene (BRCA) assessment is recommended for women who have family members with BRCA-related cancers. BRCA-related cancers include: ? Breast. ? Ovarian. ? Tubal. ? Peritoneal cancers.  Results of the assessment will determine the need for genetic counseling and BRCA1 and BRCA2 testing. Cervical Cancer Your health care provider may recommend that you be screened regularly for cancer of the pelvic organs (ovaries, uterus, and vagina).  This screening involves a pelvic examination, including checking for microscopic changes to the surface of your cervix (Pap test). You may be encouraged to have this screening done every 3 years, beginning at age 21.  For women ages 30-65, health care providers may recommend pelvic exams and Pap testing every 3 years, or they may recommend the Pap and pelvic exam, combined with testing for human papilloma virus (HPV), every 5 years. Some types of HPV increase your risk of cervical cancer. Testing for HPV may also be done on women of any age with unclear Pap test results.  Other health care providers may not recommend any screening for nonpregnant women who are considered low risk for pelvic cancer and who do not have symptoms. Ask your health care provider if a screening pelvic exam is right for you.  If you have had past treatment for cervical cancer or a condition that could lead to cancer, you need Pap tests and screening for cancer for at least 20 years after your treatment. If Pap tests have been discontinued, your risk factors (such as having a new sexual partner) need to be reassessed to determine if screening should resume. Some women have medical problems that increase the chance of getting cervical cancer. In these cases, your health care provider may recommend more frequent screening and Pap tests. Colorectal Cancer  This type of cancer can be detected and often prevented.  Routine colorectal cancer screening usually begins at 42 years of age and continues through 42 years of age.  Your health care provider may recommend screening at an earlier age if you have risk factors for colon cancer.  Your health care provider may also recommend using home test kits to check for hidden blood in the stool.  A small camera at the end of a tube can be used to examine your colon directly (sigmoidoscopy or colonoscopy). This is done to check for the earliest forms of colorectal cancer.  Routine  screening usually begins at age 50.  Direct examination of the colon should be repeated every 5-10 years through 42 years of age. However, you may need to be screened more often if early forms of precancerous polyps or small growths are found. Skin Cancer  Check your skin from head to toe regularly.  Tell your health care provider about any new moles or changes in moles, especially if there is a change in a mole's shape or color.  Also tell your health care provider if you have a mole that is larger than the size of a pencil eraser.  Always use sunscreen. Apply sunscreen liberally and repeatedly throughout the day.  Protect yourself by wearing long sleeves, pants, a wide-brimmed hat, and sunglasses whenever you are outside. Heart disease, diabetes, and high blood pressure  High blood pressure causes heart disease and increases the risk of stroke. High blood pressure is more likely to develop in: ? People who have blood pressure in the high end of the normal range (130-139/85-89 mm Hg). ? People   who are overweight or obese. ? People who are African American.  If you are 84-22 years of age, have your blood pressure checked every 3-5 years. If you are 67 years of age or older, have your blood pressure checked every year. You should have your blood pressure measured twice-once when you are at a hospital or clinic, and once when you are not at a hospital or clinic. Record the average of the two measurements. To check your blood pressure when you are not at a hospital or clinic, you can use: ? An automated blood pressure machine at a pharmacy. ? A home blood pressure monitor.  If you are between 52 years and 3 years old, ask your health care provider if you should take aspirin to prevent strokes.  Have regular diabetes screenings. This involves taking a blood sample to check your fasting blood sugar level. ? If you are at a normal weight and have a low risk for diabetes, have this test once  every three years after 42 years of age. ? If you are overweight and have a high risk for diabetes, consider being tested at a younger age or more often. Preventing infection Hepatitis B  If you have a higher risk for hepatitis B, you should be screened for this virus. You are considered at high risk for hepatitis B if: ? You were born in a country where hepatitis B is common. Ask your health care provider which countries are considered high risk. ? Your parents were born in a high-risk country, and you have not been immunized against hepatitis B (hepatitis B vaccine). ? You have HIV or AIDS. ? You use needles to inject street drugs. ? You live with someone who has hepatitis B. ? You have had sex with someone who has hepatitis B. ? You get hemodialysis treatment. ? You take certain medicines for conditions, including cancer, organ transplantation, and autoimmune conditions. Hepatitis C  Blood testing is recommended for: ? Everyone born from [birth year] through 1964. ? Anyone with known risk factors for hepatitis C. Sexually transmitted infections (STIs)  You should be screened for sexually transmitted infections (STIs) including gonorrhea and chlamydia if: ? You are sexually active and are younger than 42 years of age. ? You are older than 42 years of age and your health care provider tells you that you are at risk for this type of infection. ? Your sexual activity has changed since you were last screened and you are at an increased risk for chlamydia or gonorrhea. Ask your health care provider if you are at risk.  If you do not have HIV, but are at risk, it may be recommended that you take a prescription medicine daily to prevent HIV infection. This is called pre-exposure prophylaxis (PrEP). You are considered at risk if: ? You are sexually active and do not regularly use condoms or know the HIV status of your partner(s). ? You take drugs by injection. ? You are sexually active with a partner  who has HIV. Talk with your health care provider about whether you are at high risk of being infected with HIV. If you choose to begin PrEP, you should first be tested for HIV. You should then be tested every 3 months for as long as you are taking PrEP. Pregnancy  If you are premenopausal and you may become pregnant, ask your health care provider about preconception counseling.  If you may become pregnant, take 400 to 800 micrograms (mcg) of folic acid every  day.  If you want to prevent pregnancy, talk to your health care provider about birth control (contraception). Osteoporosis and menopause  Osteoporosis is a disease in which the bones lose minerals and strength with aging. This can result in serious bone fractures. Your risk for osteoporosis can be identified using a bone density scan.  If you are 65 years of age or older, or if you are at risk for osteoporosis and fractures, ask your health care provider if you should be screened.  Ask your health care provider whether you should take a calcium or vitamin D supplement to lower your risk for osteoporosis.  Menopause may have certain physical symptoms and risks.  Hormone replacement therapy may reduce some of these symptoms and risks. Talk to your health care provider about whether hormone replacement therapy is right for you. Follow these instructions at home:  Schedule regular health, dental, and eye exams.  Stay current with your immunizations.  Do not use any tobacco products including cigarettes, chewing tobacco, or electronic cigarettes.  If you are pregnant, do not drink alcohol.  If you are breastfeeding, limit how much and how often you drink alcohol.  Limit alcohol intake to no more than 1 drink per day for nonpregnant women. One drink equals 12 ounces of beer, 5 ounces of wine, or 1 ounces of hard liquor.  Do not use street drugs.  Do not share needles.  Ask your health care provider for help if you need support  or information about quitting drugs.  Tell your health care provider if you often feel depressed.  Tell your health care provider if you have ever been abused or do not feel safe at home. This information is not intended to replace advice given to you by your health care provider. Make sure you discuss any questions you have with your health care provider. Document Released: 12/05/2010 Document Revised: 10/28/2015 Document Reviewed: 02/23/2015 Elsevier Interactive Patient Education  2019 [employer]  Carbohydrate Counting for Diabetes Mellitus, Adult  Carbohydrate counting is a method of keeping track of how many carbohydrates you eat. Eating carbohydrates naturally increases the amount of sugar (glucose) in the blood. Counting how many carbohydrates you eat helps keep your blood glucose within normal limits, which helps you manage your diabetes (diabetes mellitus). It is important to know how many carbohydrates you can safely have in each meal. This is different for every person. A diet and nutrition specialist (registered dietitian) can help you make a meal plan and calculate how many carbohydrates you should have at each meal and snack. Carbohydrates are found in the following foods:  Grains, such as breads and cereals.  Dried beans and soy products.  Starchy vegetables, such as potatoes, peas, and corn.  Fruit and fruit juices.  Milk and yogurt.  Sweets and snack foods, such as cake, cookies, candy, chips, and soft drinks. How do I count carbohydrates? There are two ways to count carbohydrates in food. You can use either of the methods or a combination of both. Reading "Nutrition Facts" on packaged food The "Nutrition Facts" list is included on the labels of almost all packaged foods and beverages in the [place] It includes:  The serving size.  Information about nutrients in each serving, including the grams (g) of carbohydrate per serving. To use the "Nutrition  Facts":  Decide how many servings you will have.  Multiply the number of servings by the number of carbohydrates per serving.  The resulting number is the total amount of   that you will be having. Learning standard serving sizes of other foods When you eat carbohydrate foods that are not packaged or do not include "Nutrition Facts" on the label, you need to measure the servings in order to count the amount of carbohydrates:  Measure the foods that you will eat with a food scale or measuring cup, if needed.  Decide how many standard-size servings you will eat.  Multiply the number of servings by 15. Most carbohydrate-rich foods have about 15 g of carbohydrates per serving. ? For example, if you eat 8 oz (170 g) of strawberries, you will have eaten 2 servings and 30 g of carbohydrates (2 servings x 15 g = 30 g).  For foods that have more than one food mixed, such as soups and casseroles, you must count the carbohydrates in each food that is included. The following list contains standard serving sizes of common carbohydrate-rich foods. Each of these servings has about 15 g of carbohydrates:   hamburger bun or  English muffin.   oz (15 mL) syrup.   oz (14 g) jelly.  1 slice of bread.  1 six-inch tortilla.  3 oz (85 g) cooked rice or pasta.  4 oz (113 g) cooked dried beans.  4 oz (113 g) starchy vegetable, such as peas, corn, or potatoes.  4 oz (113 g) hot cereal.  4 oz (113 g) mashed potatoes or  of a large baked potato.  4 oz (113 g) canned or frozen fruit.  4 oz (120 mL) fruit juice.  4-6 crackers.  6 chicken nuggets.  6 oz (170 g) unsweetened dry cereal.  6 oz (170 g) plain fat-free yogurt or yogurt sweetened with artificial sweeteners.  8 oz (240 mL) milk.  8 oz (170 g) fresh fruit or one small piece of fruit.  24 oz (680 g) popped popcorn. Example of carbohydrate counting Sample meal  3 oz (85 g) chicken breast.  6 oz (170 g) brown rice.  4 oz (113 g)  corn.  8 oz (240 mL) milk.  8 oz (170 g) strawberries with sugar-free whipped topping. Carbohydrate calculation 1. Identify the foods that contain carbohydrates: ? Rice. ? Corn. ? Milk. ? Strawberries. 2. Calculate how many servings you have of each food: ? 2 servings rice. ? 1 serving corn. ? 1 serving milk. ? 1 serving strawberries. 3. Multiply each number of servings by 15 g: ? 2 servings rice x 15 g = 30 g. ? 1 serving corn x 15 g = 15 g. ? 1 serving milk x 15 g = 15 g. ? 1 serving strawberries x 15 g = 15 g. 4. Add together all of the amounts to find the total grams of carbohydrates eaten: ? 30 g + 15 g + 15 g + 15 g = 75 g of carbohydrates total. Summary  Carbohydrate counting is a method of keeping track of how many carbohydrates you eat.  Eating carbohydrates naturally increases the amount of sugar (glucose) in the blood.  Counting how many carbohydrates you eat helps keep your blood glucose within normal limits, which helps you manage your diabetes.  A diet and nutrition specialist (registered dietitian) can help you make a meal plan and calculate how many carbohydrates you should have at each meal and snack. This information is not intended to replace advice given to you by your health care provider. Make sure you discuss any questions you have with your health care provider. Document Released: 05/22/2005 Document Revised: 11/29/2016 Document  Reviewed: 11/03/2015 Elsevier Interactive Patient Education  2019 [place] Eating Plan DASH stands for "Dietary Approaches to Stop Hypertension." The DASH eating plan is a healthy eating plan that has been shown to reduce high blood pressure (hypertension). It may also reduce your risk for type 2 diabetes, heart disease, and stroke. The DASH eating plan may also help with weight loss. What are tips for following this plan?  General guidelines  Avoid eating more than 2,300 mg (milligrams) of salt (sodium) a day. If  you have hypertension, you may need to reduce your sodium intake to 1,500 mg a day.  Limit alcohol intake to no more than 1 drink a day for nonpregnant women and 2 drinks a day for men. One drink equals 12 oz of beer, 5 oz of wine, or 1 oz of hard liquor.  Work with your health care provider to maintain a healthy body weight or to lose weight. Ask what an ideal weight is for you.  Get at least 30 minutes of exercise that causes your heart to beat faster (aerobic exercise) most days of the week. Activities may include walking, swimming, or biking.  Work with your health care provider or diet and nutrition specialist (dietitian) to adjust your eating plan to your individual calorie needs. Reading food labels   Check food labels for the amount of sodium per serving. Choose foods with less than 5 percent of the Daily Value of sodium. Generally, foods with less than 300 mg of sodium per serving fit into this eating plan.  To find whole grains, look for the word "whole" as the first word in the ingredient list. Shopping  Buy products labeled as "low-sodium" or "no salt added."  Buy fresh foods. Avoid canned foods and premade or frozen meals. Cooking  Avoid adding salt when cooking. Use salt-free seasonings or herbs instead of table salt or sea salt. Check with your health care provider or pharmacist before using salt substitutes.  Do not fry foods. Cook foods using healthy methods such as baking, boiling, grilling, and broiling instead.  Cook with heart-healthy oils, such as olive, canola, soybean, or sunflower oil. Meal planning  Eat a balanced diet that includes: ? 5 or more servings of fruits and vegetables each day. At each meal, try to fill half of your plate with fruits and vegetables. ? Up to 6-8 servings of whole grains each day. ? Less than 6 oz of lean meat, poultry, or fish each day. A 3-oz serving of meat is about the same size as a deck of cards. One egg equals 1 oz. ? 2  servings of low-fat dairy each day. ? A serving of nuts, seeds, or beans 5 times each week. ? Heart-healthy fats. Healthy fats called Omega-3 fatty acids are found in foods such as flaxseeds and coldwater fish, like sardines, salmon, and mackerel.  Limit how much you eat of the following: ? Canned or prepackaged foods. ? Food that is high in trans fat, such as fried foods. ? Food that is high in saturated fat, such as fatty meat. ? Sweets, desserts, sugary drinks, and other foods with added sugar. ? Full-fat dairy products.  Do not salt foods before eating.  Try to eat at least 2 vegetarian meals each week.  Eat more home-cooked food and less restaurant, buffet, and fast food.  When eating at a restaurant, ask that your food be prepared with less salt or no salt, if possible. What foods are recommended? The items listed  may not be a complete list. Talk with your dietitian about what dietary choices are best for you. Grains Whole-grain or whole-wheat bread. Whole-grain or whole-wheat pasta. Brown rice. [place]. Bulgur. Whole-grain and low-sodium cereals. Pita bread. Low-fat, low-sodium crackers. Whole-wheat flour tortillas. Vegetables Fresh or frozen vegetables (raw, steamed, roasted, or grilled). Low-sodium or reduced-sodium tomato and vegetable juice. Low-sodium or reduced-sodium tomato sauce and tomato paste. Low-sodium or reduced-sodium canned vegetables. Fruits All fresh, dried, or frozen fruit. Canned fruit in natural juice (without added sugar). Meat and other protein foods Skinless chicken or [place]. Ground chicken or [place]. Pork with fat trimmed off. Fish and seafood. Egg whites. Dried beans, peas, or lentils. Unsalted nuts, nut butters, and seeds. Unsalted canned beans. Lean cuts of beef with fat trimmed off. Low-sodium, lean deli meat. Dairy Low-fat (1%) or fat-free (skim) milk. Fat-free, low-fat, or reduced-fat cheeses. Nonfat, low-sodium ricotta or cottage cheese.  Low-fat or nonfat yogurt. Low-fat, low-sodium cheese. Fats and oils Soft margarine without trans fats. Vegetable oil. Low-fat, reduced-fat, or light mayonnaise and salad dressings (reduced-sodium). Canola, safflower, olive, soybean, and sunflower oils. Avocado. Seasoning and other foods Herbs. Spices. Seasoning mixes without salt. Unsalted popcorn and pretzels. Fat-free sweets. What foods are not recommended? The items listed may not be a complete list. Talk with your dietitian about what dietary choices are best for you. Grains Baked goods made with fat, such as croissants, muffins, or some breads. Dry pasta or rice meal packs. Vegetables Creamed or fried vegetables. Vegetables in a cheese sauce. Regular canned vegetables (not low-sodium or reduced-sodium). Regular canned tomato sauce and paste (not low-sodium or reduced-sodium). Regular tomato and vegetable juice (not low-sodium or reduced-sodium). Angie Fava. Olives. Fruits Canned fruit in a light or heavy syrup. Fried fruit. Fruit in cream or butter sauce. Meat and other protein foods Fatty cuts of meat. Ribs. Fried meat. Berniece Salines. Sausage. Bologna and other processed lunch meats. Salami. Fatback. Hotdogs. Bratwurst. Salted nuts and seeds. Canned beans with added salt. Canned or smoked fish. Whole eggs or egg yolks. Chicken or [place] with skin. Dairy Whole or 2% milk, cream, and half-and-half. Whole or full-fat cream cheese. Whole-fat or sweetened yogurt. Full-fat cheese. Nondairy creamers. Whipped toppings. Processed cheese and cheese spreads. Fats and oils Butter. Stick margarine. Lard. Shortening. Ghee. Bacon fat. Tropical oils, such as coconut, palm kernel, or palm oil. Seasoning and other foods Salted popcorn and pretzels. Onion salt, garlic salt, seasoned salt, table salt, and sea salt. Worcestershire sauce. Tartar sauce. Barbecue sauce. Teriyaki sauce. Soy sauce, including reduced-sodium. Steak sauce. Canned and packaged gravies. Fish sauce.  Oyster sauce. Cocktail sauce. Horseradish that you find on the shelf. Ketchup. Mustard. Meat flavorings and tenderizers. Bouillon cubes. Hot sauce and Tabasco sauce. Premade or packaged marinades. Premade or packaged taco seasonings. Relishes. Regular salad dressings. Where to find more information:  National Heart, Lung, and [place]: https://wilson-eaton.com/  American Heart Association: www.heart.org Summary  The DASH eating plan is a healthy eating plan that has been shown to reduce high blood pressure (hypertension). It may also reduce your risk for type 2 diabetes, heart disease, and stroke.  With the DASH eating plan, you should limit salt (sodium) intake to 2,300 mg a day. If you have hypertension, you may need to reduce your sodium intake to 1,500 mg a day.  When on the DASH eating plan, aim to eat more fresh fruits and vegetables, whole grains, lean proteins, low-fat dairy, and heart-healthy fats.  Work with your health care provider or diet  and nutrition specialist (dietitian) to adjust your eating plan to your individual calorie needs. This information is not intended to replace advice given to you by your health care provider. Make sure you discuss any questions you have with your health care provider. Document Released: 05/11/2011 Document Revised: 05/15/2016 Document Reviewed: 05/15/2016 Elsevier Interactive Patient Education  2019 [employer].

## 2018-06-17 NOTE — Progress Notes (Signed)
Ashley Steele 1975-07-06 [id number]    History:    Presents for annual exam.  Monthly cycle on Loestrin 1/20 without complaint.  Not sexually active greater than 1 year.  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  Normal Pap history has not had a screening mammogram.  History of asymptomatic fibroids.  Has had elevated blood pressure in the past has been on medication and then taken off.  Past medical history, past surgical history, family history and social history were all reviewed and documented in the EPIC chart.  Daughter 36 doing well.  Father hypertension.  Has a twin sister healthy.  ROS:  A ROS was performed and pertinent positives and negatives are included.  Exam:  Vitals:   06/17/18 0949  BP: (!) 144/90  Weight: 214 lb (97.1 kg)  Height: 5' 3.5" (1.613 m)   Body mass index is 37.31 kg/m.   General appearance:  Normal Thyroid:  Symmetrical, normal in size, without palpable masses or nodularity. Respiratory  Auscultation:  Clear without wheezing or rhonchi Cardiovascular  Auscultation:  Regular rate, without rubs, murmurs or gallops  Edema/varicosities:  Not grossly evident Abdominal  Soft,nontender, without masses, guarding or rebound.  Liver/spleen:  No organomegaly noted  Hernia:  None appreciated  Skin  Inspection:  Grossly normal   Breasts: Examined lying and sitting.     Right: Without masses, retractions, discharge or axillary adenopathy.     Left: Without masses, retractions, discharge or axillary adenopathy. Gentitourinary   Inguinal/mons:  Normal without inguinal adenopathy  External genitalia:  Normal  BUS/Urethra/Skene's glands:  Normal  Vagina:  Normal  Cervix:  Normal  Uterus: Enlarged/fibroids 10 weeks size, shape and contour.  Midline and mobile  Adnexa/parametria:     Rt: Without masses or tenderness.   Lt: Without masses or tenderness.  Anus and perineum: Normal  Digital rectal exam: Normal sphincter tone  without palpated masses or tenderness  Assessment/Plan:  43 y.o. SBF G2 P1 for annual exam no complaints.  Monthly cycle on OCs Elevated blood pressure Fibroid uterus Obesity Labs-primary care  Plan: Reviewed blood pressure elevated, will stop combination OCs after current pack and try Micronor prescription, proper use, no placebo week reviewed, encouraged condoms if sexually active.  Instructed to check blood pressure away from office if continues greater than 130/80 follow-up with primary care.  SBEs, reviewed importance of annual screening mammogram breast center information given instructed to schedule.  Denies need for STD screen.  Pap normal 2018, new screening guidelines reviewed.    [facility], 1:18 PM 06/17/2018

## 2018-09-18 ENCOUNTER — Other Ambulatory Visit: Payer: Self-pay

## 2018-09-18 ENCOUNTER — Ambulatory Visit: Payer: BLUE CROSS/BLUE SHIELD | Admitting: Women's Health

## 2018-09-18 ENCOUNTER — Encounter: Payer: Self-pay | Admitting: Women's Health

## 2018-09-18 VITALS — BP 146/94

## 2018-09-18 DIAGNOSIS — N920 Excessive and frequent menstruation with regular cycle: Secondary | ICD-10-CM | POA: Diagnosis not present

## 2018-09-18 MED ORDER — MEGESTROL ACETATE 40 MG PO TABS: 40.0000 mg | ORAL_TABLET | Freq: Two times a day (BID) | ORAL | 0 refills | Status: AC

## 2018-09-18 NOTE — Progress Notes (Signed)
43 year old SBF G2, P1 presents with heavy prolonged menstrual cycle, on Micronor  started in January 2020.  Had been on Loestrin 1/20 prior with regular monthly cycles but changed to Micronor due to elevated blood pressure, is now back on medication for her blood pressure which has done better but slightly elevated today.  States cycle started April 1  like a regular cycle for the first week, second week has been heavier with clots, changing every 2 hours, saturating tampons as well as pads.  Minimal cramping.  Denies vaginal discharge, odor, urinary symptoms, abdominal/back pain, vertigo or fever.  Not sexually active in greater than 6 months, denies need for STD screen.  08/2016 ultrasound 6 cm fibroid.  Reports numerous family members with history of fibroids/hysterectomies.  Exam: Appears well.  Abdomen soft, obese, nontender, external genitalia within normal limits, speculum exam copious menses type blood noted, no odor or erythema noted.  Bimanual uterus enlarged 10 weeks size, history of fibroids.  Menorrhagia with fibroid uterus Hypertension-primary care manages  Plan: Options reviewed, Megace 40 mg p.o. twice daily for 10 days, instructed to call if bleeding does not stop.  Start back on Micronor after completing Megace.  Mirena IUD information given reviewed slight risk for infection,  perforation or hemorrhage.  Reviewed most likely would decrease menstrual flow and would also be contraceptive.  Will check insurance coverage and schedule with Dr. Dellis Filbert.Marland Kitchen

## 2018-09-18 NOTE — Patient Instructions (Addendum)
Mammogram  Breast center  [phone]  megace twice for 10 days, call if bleeding does not stop,  Start back on micronor when finished with megace  Uterine Fibroids  Uterine fibroids (leiomyomas) are noncancerous (benign) tumors that can develop in the uterus. Fibroids may also develop in the fallopian tubes, cervix, or tissues (ligaments) near the uterus. You may have one or many fibroids. Fibroids vary in size, weight, and where they grow in the uterus. Some can become quite large. Most fibroids do not require medical treatment. What are the causes? The cause of this condition is not known. What increases the risk? You are more likely to develop this condition if you:  Are in your 30s or 40s and have not gone through menopause.  Have a family history of this condition.  Are of African-American descent.  Had your first period at an early age (early menarche).  Have not had any children (nulliparity).  Are overweight or obese. What are the signs or symptoms? Many women do not have any symptoms. Symptoms of this condition may include:  Heavy menstrual bleeding.  Bleeding or spotting between periods.  Pain and pressure in the pelvic area, between the hips.  Bladder problems, such as needing to urinate urgently or more often than usual.  Inability to have children (infertility).  Failure to carry pregnancy to term (miscarriage). How is this diagnosed? This condition may be diagnosed based on:  Your symptoms and medical history.  A physical exam.  A pelvic exam that includes feeling for any tumors.  Imaging tests, such as ultrasound or MRI. How is this treated? Treatment for this condition may include:  Seeing your health care provider for follow-up visits to monitor your fibroids for any changes.  Taking NSAIDs such as ibuprofen, naproxen, or aspirin to reduce pain.  Hormone medicines. These may be taken as a pill, given in an injection, or delivered by a T-shaped  device that is inserted into the uterus (intrauterine device, IUD).  Surgery to remove one of the following: ? The fibroids (myomectomy). Your health care provider may recommend this if fibroids affect your fertility and you want to become pregnant. ? The uterus (hysterectomy). ? Blood supply to the fibroids (uterine artery embolization). Follow these instructions at home:  Take over-the-counter and prescription medicines only as told by your health care provider.  Ask your health care provider if you should take iron pills or eat more iron-rich foods, such as dark green, leafy vegetables. Heavy menstrual bleeding can cause low iron levels.  If directed, apply heat to your back or abdomen to reduce pain. Use the heat source that your health care provider recommends, such as a moist heat pack or a heating pad. ? Place a towel between your skin and the heat source. ? Leave the heat on for 20-30 minutes. ? Remove the heat if your skin turns bright red. This is especially important if you are unable to feel pain, heat, or cold. You may have a greater risk of getting burned.  Pay close attention to your menstrual cycle. Tell your health care provider about any changes, such as: ? Increased blood flow that requires you to use more pads or tampons than usual. ? A change in the number of days that your period lasts. ? A change in symptoms that are associated with your period, such as back pain or cramps in your abdomen.  Keep all follow-up visits as told by your health care provider. This is important, especially if  your fibroids need to be monitored for any changes. Contact a health care provider if you:  Have pelvic pain, back pain, or cramps in your abdomen that do not get better with medicine or heat.  Develop new bleeding between periods.  Have increased bleeding during or between periods.  Feel unusually tired or weak.  Feel light-headed. Get help right away if you:  Faint.  Have  pelvic pain that suddenly gets worse.  Have severe vaginal bleeding that soaks a tampon or pad in 30 minutes or less. Summary  Uterine fibroids are noncancerous (benign) tumors that can develop in the uterus.  The exact cause of this condition is not known.  Most fibroids do not require medical treatment unless they affect your ability to have children (fertility).  Contact a health care provider if you have pelvic pain, back pain, or cramps in your abdomen that do not get better with medicines.  Make sure you know what symptoms should cause you to get help right away. This information is not intended to replace advice given to you by your health care provider. Make sure you discuss any questions you have with your health care provider. Document Released: 05/19/2000 Document Revised: 04/17/2017 Document Reviewed: 04/17/2017 Elsevier Interactive Patient Education  2019 [employer] Menorrhagia  Menorrhagia is a condition in which menstrual periods are heavy or last longer than normal. With menorrhagia, most periods a woman has may cause enough blood loss and cramping that she becomes unable to take part in her usual activities. What are the causes? Common causes of this condition include:  Noncancerous growths in the uterus (polyps or fibroids).  An imbalance of the estrogen and progesterone hormones.  One of the ovaries not releasing an egg during one or more months.  A problem with the thyroid gland (hypothyroid).  Side effects of having an intrauterine device (IUD).  Side effects of some medicines, such as anti-inflammatory medicines or blood thinners.  A bleeding disorder that stops the blood from clotting normally. In some cases, the cause of this condition is not known. What are the signs or symptoms? Symptoms of this condition include:  Routinely having to change your pad or tampon every 1-2 hours because it is completely soaked.  Needing to use pads and tampons at  the same time because of heavy bleeding.  Needing to wake up to change your pads or tampons during the night.  Passing blood clots larger than 1 inch (2.5 cm) in size.  Having bleeding that lasts for more than 7 days.  Having symptoms of low iron levels (anemia), such as tiredness, fatigue, or shortness of breath. How is this diagnosed? This condition may be diagnosed based on:  A physical exam.  Your symptoms and menstrual history.  Tests, such as: ? Blood tests to check if you are pregnant or have hormonal changes, a bleeding or thyroid disorder, anemia, or other problems. ? Pap test to check for cancerous changes, infections, or inflammation. ? Endometrial biopsy. This test involves removing a tissue sample from the lining of the uterus (endometrium) to be examined under a microscope. ? Pelvic ultrasound. This test uses sound waves to create images of your uterus, ovaries, and vagina. The images can show if you have fibroids or other growths. ? Hysteroscopy. For this test, a small telescope is used to look inside your uterus. How is this treated? Treatment may not be needed for this condition. If it is needed, the best treatment for you will depend on:  Whether you need to prevent pregnancy.  Your desire to have children in the future.  The cause and severity of your bleeding.  Your personal preference. Medicines are the first step in treatment. You may be treated with:  Hormonal birth control methods. These treatments reduce bleeding during your menstrual period. They include: ? Birth control pills. ? Skin patch. ? Vaginal ring. ? Shots (injections) that you get every 3 months. ? Hormonal IUD (intrauterine device). ? Implants that go under the skin.  Medicines that thicken blood and slow bleeding.  Medicines that reduce swelling, such as ibuprofen.  Medicines that contain an artificial (synthetic) hormone called progestin.  Medicines that make the ovaries stop  working for a short time.  Iron supplements to treat anemia. If medicines do not work, surgery may be done. Surgical options may include:  Dilation and curettage (D&C). In this procedure, your health care provider opens (dilates) your cervix and then scrapes or suctions tissue from the endometrium to reduce menstrual bleeding.  Operative hysteroscopy. In this procedure, a small tube with a light on the end (hysteroscope) is used to view your uterus and help remove polyps that may be causing heavy periods.  Endometrial ablation. This is when various techniques are used to permanently destroy your entire endometrium. After endometrial ablation, most women have little or no menstrual flow. This procedure reduces your ability to become pregnant.  Endometrial resection. In this procedure, an electrosurgical wire loop is used to remove the endometrium. This procedure reduces your ability to become pregnant.  Hysterectomy. This is surgical removal of the uterus. This is a permanent procedure that stops menstrual periods. Pregnancy is not possible after a hysterectomy. Follow these instructions at home: Medicines  Take over-the-counter and prescription medicines exactly as told by your health care provider. This includes iron pills.  Do not change or switch medicines without asking your health care provider.  Do not take aspirin or medicines that contain aspirin 1 week before or during your menstrual period. Aspirin may make bleeding worse. General instructions  If you need to change your sanitary pad or tampon more than once every 2 hours, limit your activity until the bleeding stops.  Iron pills can cause constipation. To prevent or treat constipation while you are taking prescription iron supplements, your health care provider may recommend that you: ? Drink enough fluid to keep your urine clear or pale yellow. ? Take over-the-counter or prescription medicines. ? Eat foods that are high in  fiber, such as fresh fruits and vegetables, whole grains, and beans. ? Limit foods that are high in fat and processed sugars, such as fried and sweet foods.  Eat well-balanced meals, including foods that are high in iron. Foods that have a lot of iron include leafy green vegetables, meat, liver, eggs, and whole grain breads and cereals.  Do not try to lose weight until the abnormal bleeding has stopped and your blood iron level is back to normal. If you need to lose weight, work with your health care provider to lose weight safely.  Keep all follow-up visits as told by your health care provider. This is important. Contact a health care provider if:  You soak through a pad or tampon every 1 or 2 hours, and this happens every time you have a period.  You need to use pads and tampons at the same time because you are bleeding so much.  You have nausea, vomiting, diarrhea, or other problems related to medicines you are taking. Get  help right away if:  You soak through more than a pad or tampon in 1 hour.  You pass clots bigger than 1 inch (2.5 cm) wide.  You feel short of breath.  You feel like your heart is beating too fast.  You feel dizzy or faint.  You feel very weak or tired. Summary  Menorrhagia is a condition in which menstrual periods are heavy or last longer than normal.  Treatment will depend on the cause of the condition and may include medicines or procedures.  Take over-the-counter and prescription medicines exactly as told by your health care provider. This includes iron pills.  Get help right away if you have heavy bleeding that soaks through more than a pad or tampon in 1 hour, you are passing large clots, or you feel dizzy, faint or short of breath. This information is not intended to replace advice given to you by your health care provider. Make sure you discuss any questions you have with your health care provider. Document Released: 05/22/2005 Document Revised:  05/15/2016 Document Reviewed: 05/15/2016 Elsevier Interactive Patient Education  2019 [employer]. Levonorgestrel intrauterine device (IUD) What is this medicine? LEVONORGESTREL IUD (LEE voe nor jes trel) is a contraceptive (birth control) device. The device is placed inside the uterus by a healthcare professional. It is used to prevent pregnancy. This device can also be used to treat heavy bleeding that occurs during your period. This medicine may be used for other purposes; ask your health care provider or pharmacist if you have questions. COMMON BRAND NAME(S): Minette Headland What should I tell my health care provider before I take this medicine? They need to know if you have any of these conditions: -abnormal Pap smear -cancer of the breast, uterus, or cervix -diabetes -endometritis -genital or pelvic infection now or in the past -have more than one sexual partner or your partner has more than one partner -heart disease -history of an ectopic or tubal pregnancy -immune system problems -IUD in place -liver disease or tumor -problems with blood clots or take blood-thinners -seizures -use intravenous drugs -uterus of unusual shape -vaginal bleeding that has not been explained -an unusual or allergic reaction to levonorgestrel, other hormones, silicone, or polyethylene, medicines, foods, dyes, or preservatives -pregnant or trying to get pregnant -breast-feeding How should I use this medicine? This device is placed inside the uterus by a health care professional. Talk to your pediatrician regarding the use of this medicine in children. Special care may be needed. Overdosage: If you think you have taken too much of this medicine contact a poison control center or emergency room at once. NOTE: This medicine is only for you. Do not share this medicine with others. What if I miss a dose? This does not apply. Depending on the brand of device you have inserted, the device  will need to be replaced every 3 to 5 years if you wish to continue using this type of birth control. What may interact with this medicine? Do not take this medicine with any of the following medications: -amprenavir -bosentan -fosamprenavir This medicine may also interact with the following medications: -aprepitant -armodafinil -barbiturate medicines for inducing sleep or treating seizures -bexarotene -boceprevir -griseofulvin -medicines to treat seizures like carbamazepine, ethotoin, felbamate, oxcarbazepine, phenytoin, topiramate -modafinil -pioglitazone -rifabutin -rifampin -rifapentine -some medicines to treat HIV infection like atazanavir, efavirenz, indinavir, lopinavir, nelfinavir, tipranavir, ritonavir -St. John's wort -warfarin This list may not describe all possible interactions. Give your health care provider a list of  all the medicines, herbs, non-prescription drugs, or dietary supplements you use. Also tell them if you smoke, drink alcohol, or use illegal drugs. Some items may interact with your medicine. What should I watch for while using this medicine? Visit your doctor or health care professional for regular check ups. See your doctor if you or your partner has sexual contact with others, becomes HIV positive, or gets a sexual transmitted disease. This product does not protect you against HIV infection (AIDS) or other sexually transmitted diseases. You can check the placement of the IUD yourself by reaching up to the top of your vagina with clean fingers to feel the threads. Do not pull on the threads. It is a good habit to check placement after each menstrual period. Call your doctor right away if you feel more of the IUD than just the threads or if you cannot feel the threads at all. The IUD may come out by itself. You may become pregnant if the device comes out. If you notice that the IUD has come out use a backup birth control method like condoms and call your health  care provider. Using tampons will not change the position of the IUD and are okay to use during your period. This IUD can be safely scanned with magnetic resonance imaging (MRI) only under specific conditions. Before you have an MRI, tell your healthcare provider that you have an IUD in place, and which type of IUD you have in place. What side effects may I notice from receiving this medicine? Side effects that you should report to your doctor or health care professional as soon as possible: -allergic reactions like skin rash, itching or hives, swelling of the face, lips, or tongue -fever, flu-like symptoms -genital sores -high blood pressure -no menstrual period for 6 weeks during use -pain, swelling, warmth in the leg -pelvic pain or tenderness -severe or sudden headache -signs of pregnancy -stomach cramping -sudden shortness of breath -trouble with balance, talking, or walking -unusual vaginal bleeding, discharge -yellowing of the eyes or skin Side effects that usually do not require medical attention (report to your doctor or health care professional if they continue or are bothersome): -acne -breast pain -change in sex drive or performance -changes in weight -cramping, dizziness, or faintness while the device is being inserted -headache -irregular menstrual bleeding within first 3 to 6 months of use -nausea This list may not describe all possible side effects. Call your doctor for medical advice about side effects. You may report side effects to FDA at 1-800-FDA-1088. Where should I keep my medicine? This does not apply. NOTE: This sheet is a summary. It may not cover all possible information. If you have questions about this medicine, talk to your doctor, pharmacist, or health care provider.  2019 Elsevier/Gold Standard (2016-03-03 14:14:56)

## 2018-09-19 ENCOUNTER — Telehealth: Payer: Self-pay | Admitting: *Deleted

## 2018-09-19 MED ORDER — NORETHINDRONE 0.35 MG PO TABS: 1.0000 | ORAL_TABLET | Freq: Every day | ORAL | 4 refills | Status: AC

## 2018-09-19 NOTE — Telephone Encounter (Signed)
Patient called stating her Rx is not at pharmacy for Ortho Micronor 0.35 mg tablet, per note on 09/18/18 " Megace 40 mg p.o. twice daily for 10 days, instructed to call if bleeding does not stop.  Start back on Micronor after completing Megace. Rx re-sent.

## 2020-10-12 ENCOUNTER — Encounter: Payer: Self-pay | Admitting: Obstetrics & Gynecology

## 2020-11-18 ENCOUNTER — Other Ambulatory Visit: Payer: Self-pay | Admitting: Obstetrics and Gynecology

## 2020-11-18 DIAGNOSIS — R928 Other abnormal and inconclusive findings on diagnostic imaging of breast: Secondary | ICD-10-CM

## 2020-11-26 ENCOUNTER — Ambulatory Visit
Admission: RE | Admit: 2020-11-26 | Discharge: 2020-11-26 | Disposition: A | Discharge status: Home / Self Care | Payer: BC Managed Care – PPO | Source: Ambulatory Visit | Attending: Obstetrics and Gynecology | Admitting: Obstetrics and Gynecology

## 2020-11-26 ENCOUNTER — Other Ambulatory Visit: Payer: Self-pay

## 2020-11-26 DIAGNOSIS — R928 Other abnormal and inconclusive findings on diagnostic imaging of breast: Secondary | ICD-10-CM

## 2021-12-04 IMAGING — MG MM DIGITAL DIAGNOSTIC UNILAT*L* W/ TOMO W/ CAD
4 series · 4 of 12 positions shown · non-contrast
Comparison: Baseline screening mammogram dated 11/15/2020.

CLINICAL DATA: Patient was recalled from baseline screening
mammogram for a left breast mass.

EXAM:
DIGITAL DIAGNOSTIC UNILATERAL LEFT MAMMOGRAM WITH TOMOSYNTHESIS;
ULTRASOUND LEFT BREAST LIMITED
TECHNIQUE: Left digital diagnostic mammography and breast tomosynthesis was
performed.; Targeted ultrasound examination of the left breast was
performed

[L CC synth-2D]
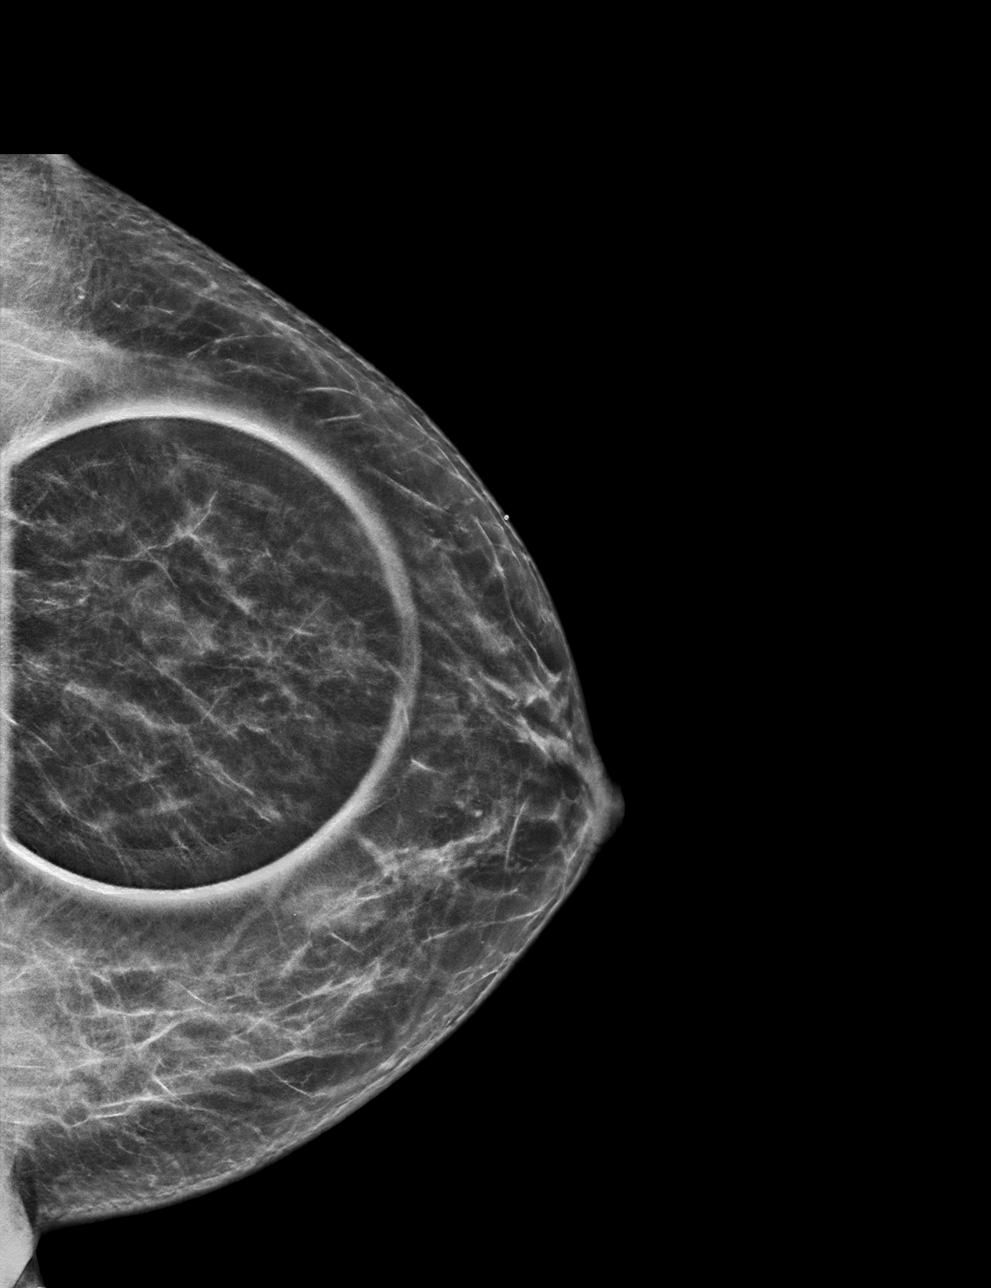

[L MLO synth-2D]
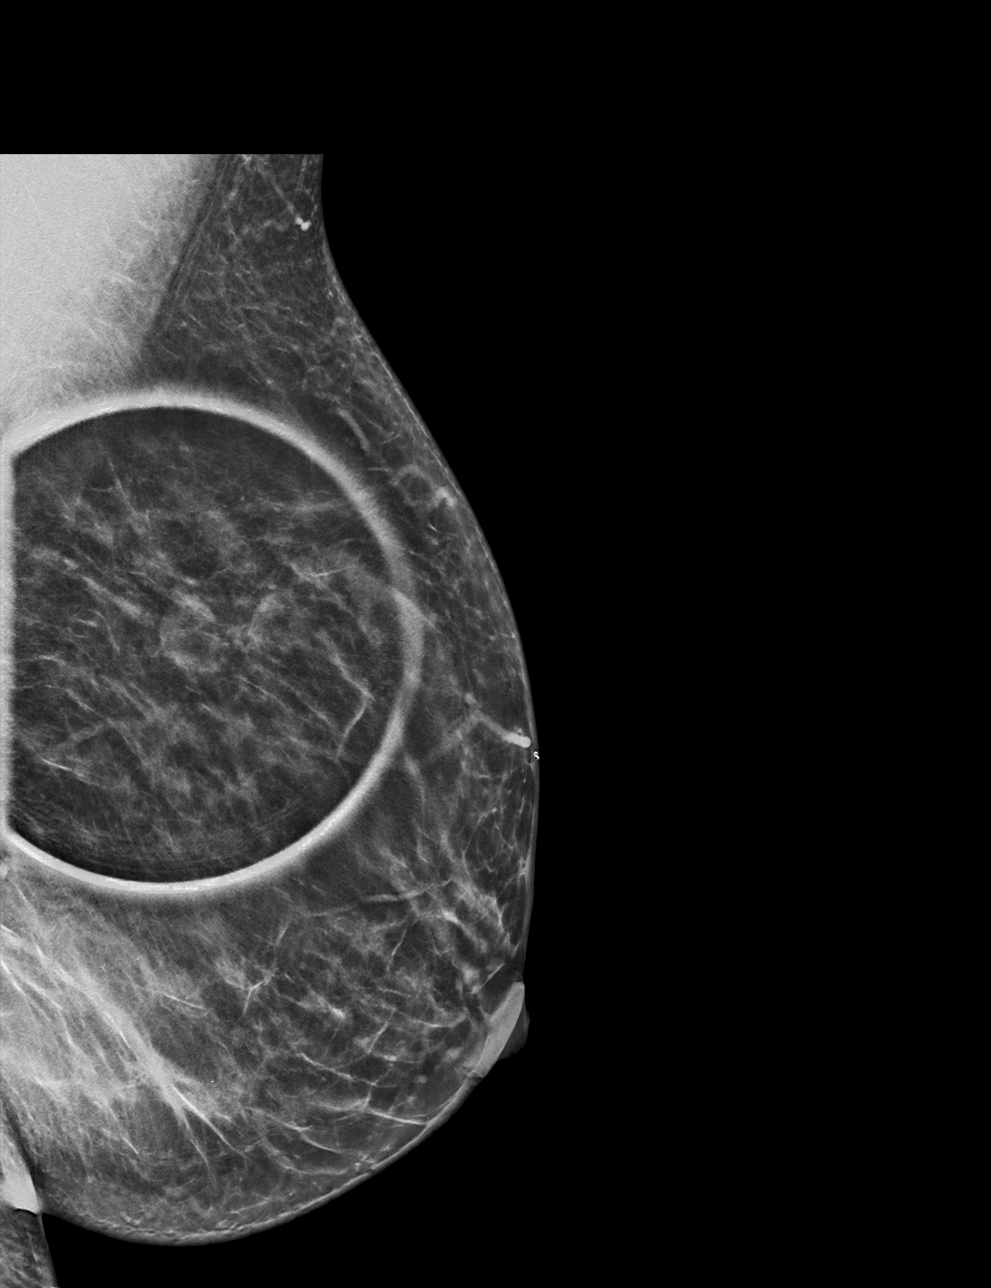

[L CC tomo · tomo slice 29/57.0]
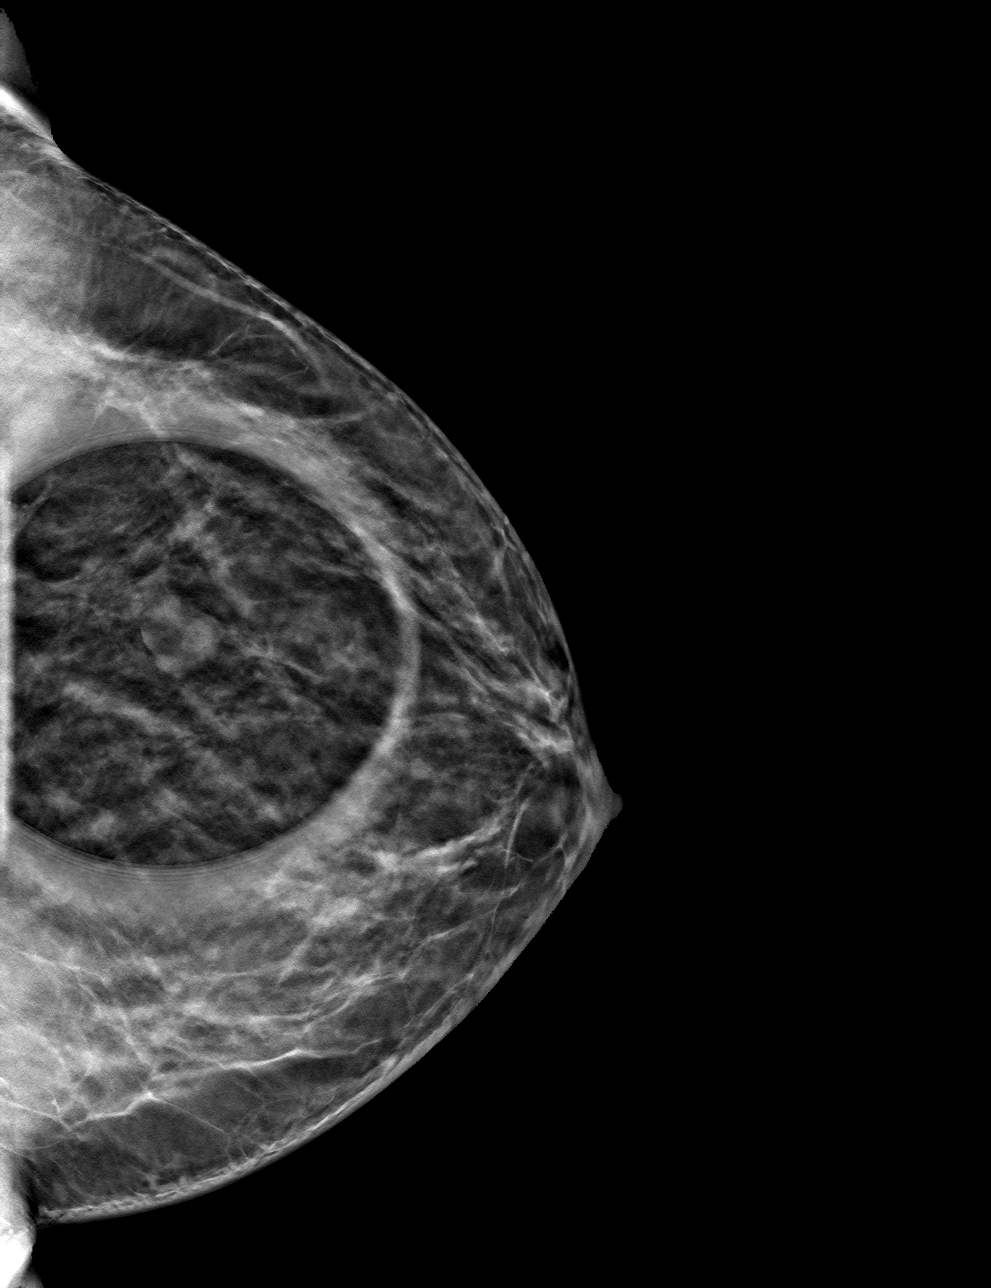

[L MLO tomo · tomo slice 31/60.0]
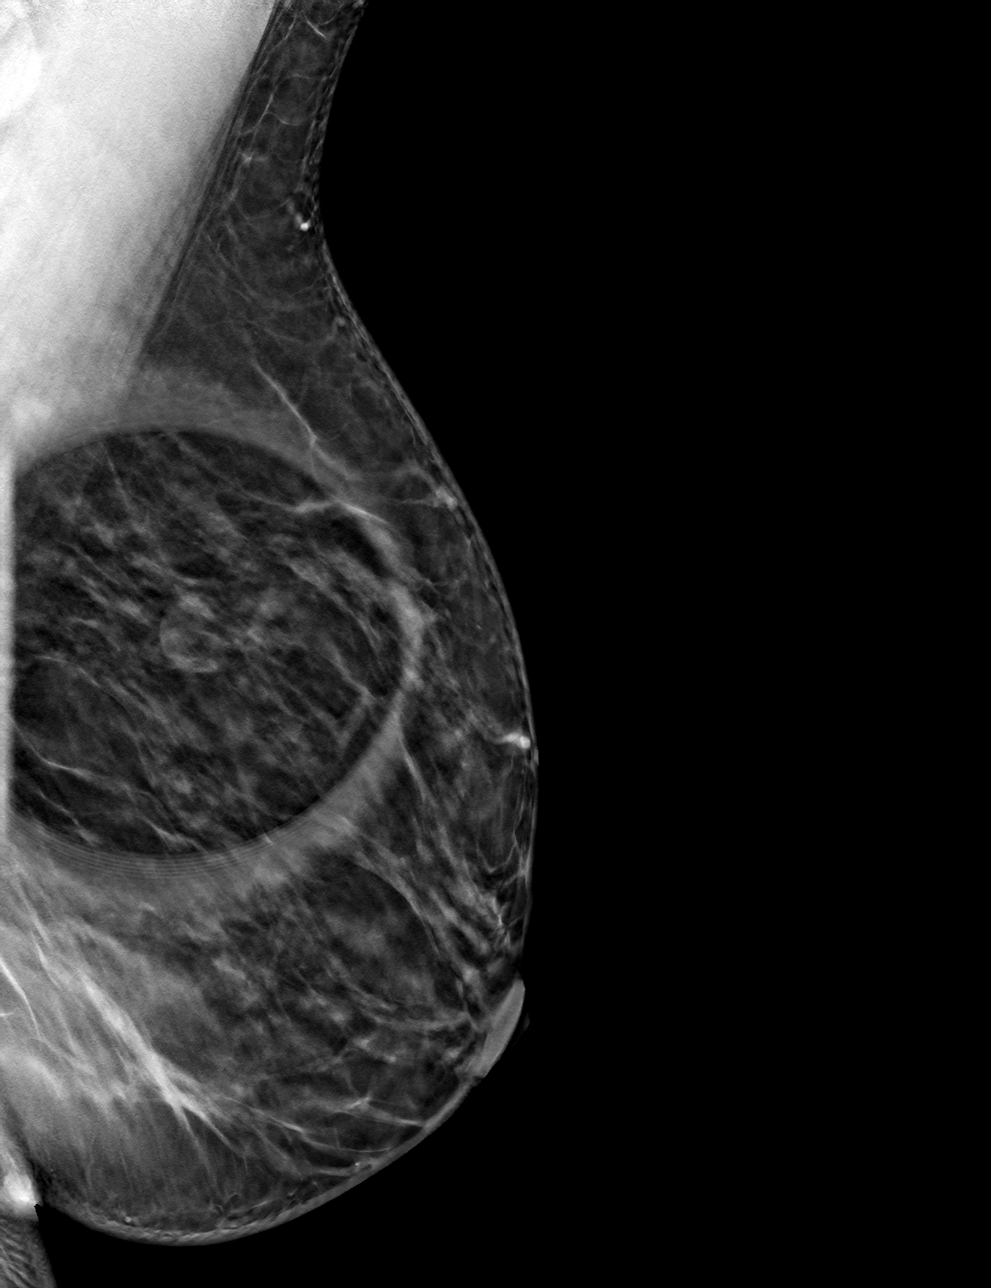

[4 of 12 positions shown; findings below may reference images not displayed]

ACR Breast Density Category b: There are scattered areas of
fibroglandular density.
FINDINGS: Additional imaging of the right breast was performed. There is
persistence of a 1.4 cm circumscribed mass in the upper aspect of
the breast.

On physical exam, I do not palpate a mass in the upper left breast.

Targeted ultrasound is performed, showing a well-circumscribed
anechoic cyst in the left breast at 12-12:30 in the 4 o'clock region
of the breast measuring 1.1 x 0.6 x 1.4 cm.
IMPRESSION: Left breast cyst.  No evidence of malignancy in the left breast.

RECOMMENDATION:
Bilateral screening mammogram in 1 year is recommended.

I have discussed the findings and recommendations with the patient.
If applicable, a reminder letter will be sent to the patient
regarding the next appointment.

BI-RADS CATEGORY  2: Benign.

## 2021-12-04 IMAGING — US US BREAST*L* LIMITED INC AXILLA
1 series · 7 of 7 positions shown · non-contrast
Comparison: Baseline screening mammogram dated 11/15/2020.

CLINICAL DATA: Patient was recalled from baseline screening
mammogram for a left breast mass.

EXAM:
DIGITAL DIAGNOSTIC UNILATERAL LEFT MAMMOGRAM WITH TOMOSYNTHESIS;
ULTRASOUND LEFT BREAST LIMITED
TECHNIQUE: Left digital diagnostic mammography and breast tomosynthesis was
performed.; Targeted ultrasound examination of the left breast was
performed

[Series 1: us breast*left* limited inc axilla · 0.08mm/px · 7 of 7 slices shown]
[im 1/7]
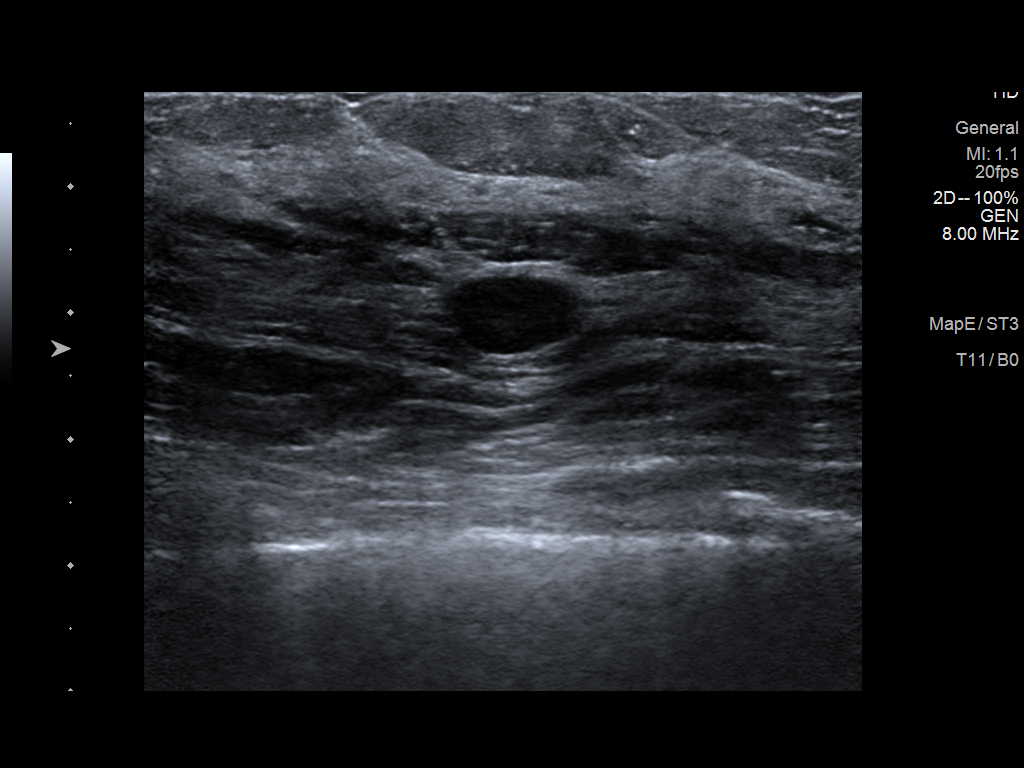
[im 2/7]
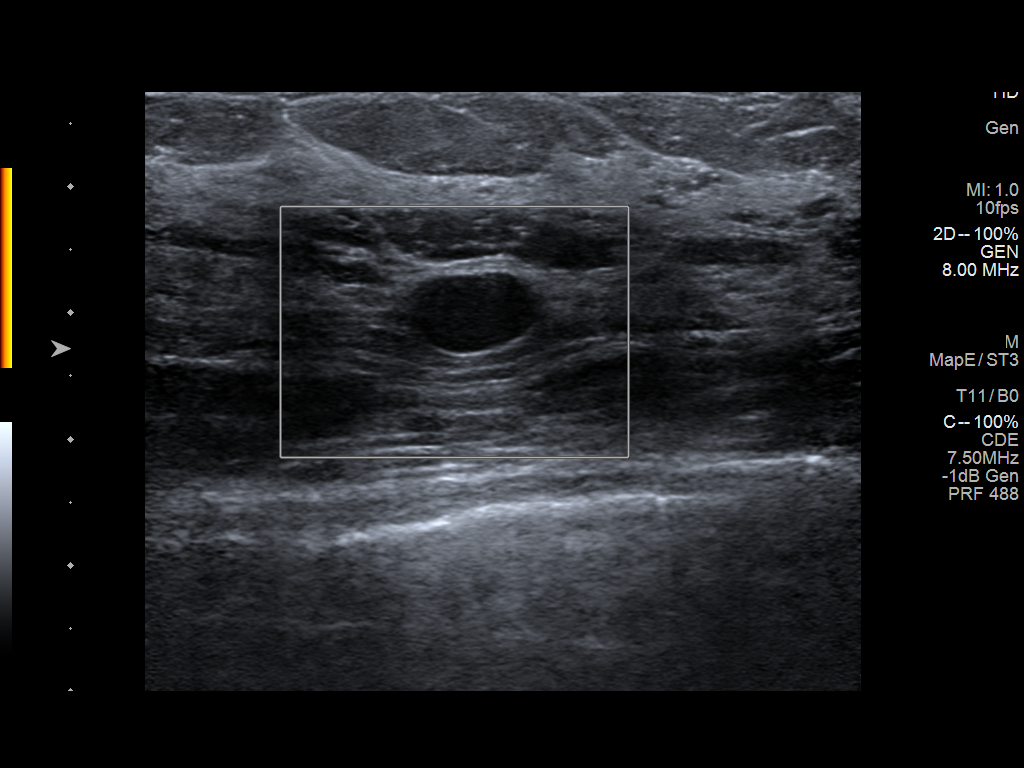
[im 3/7]
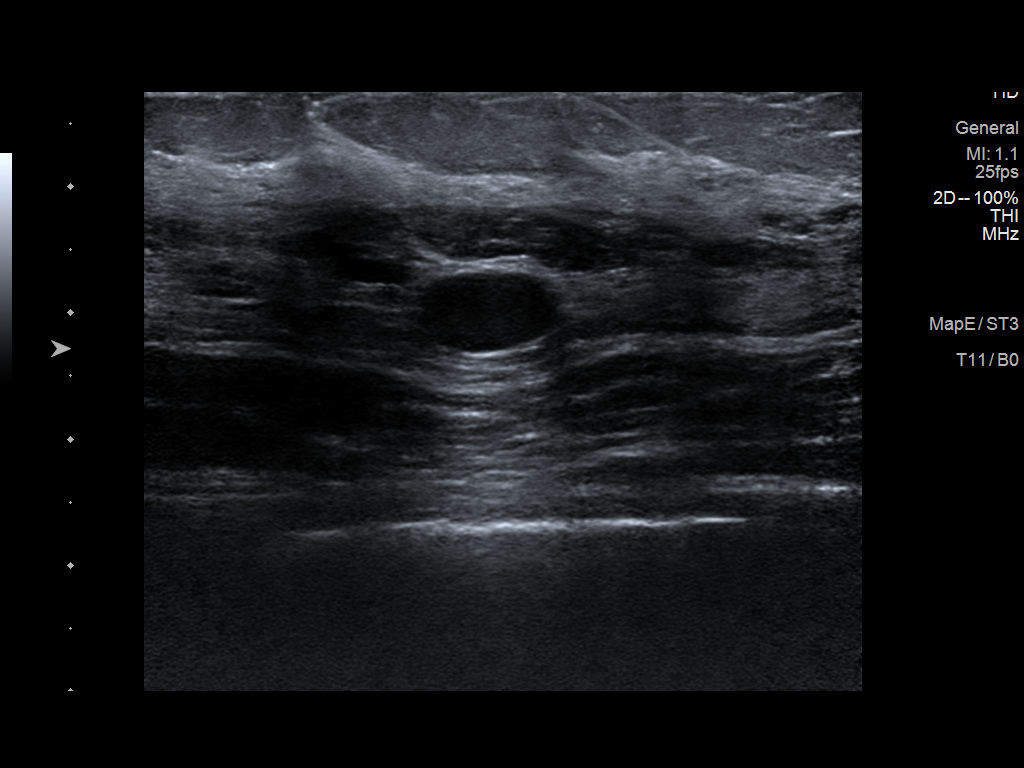
[im 4/7]
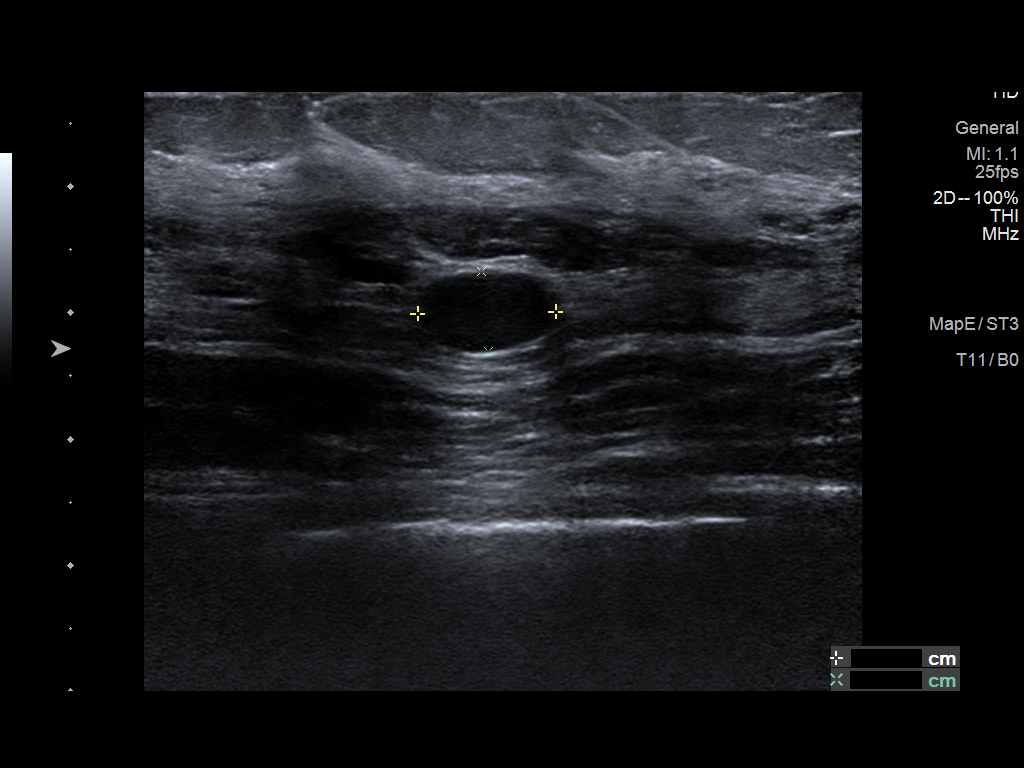
[im 5/7]
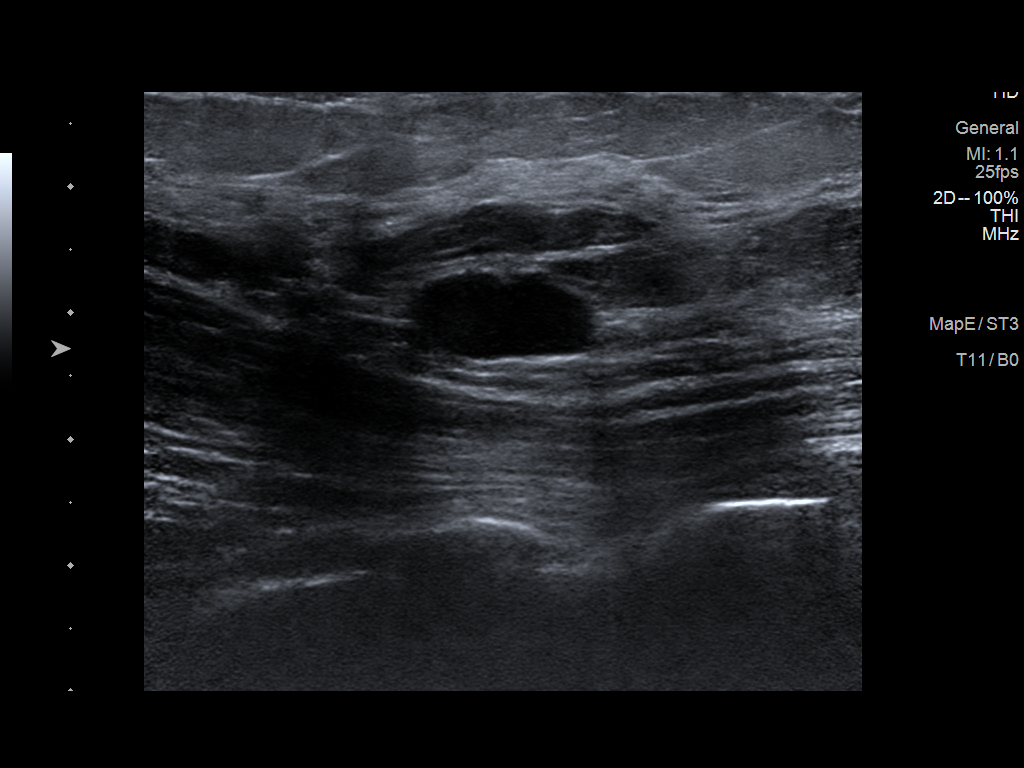
[im 6/7]
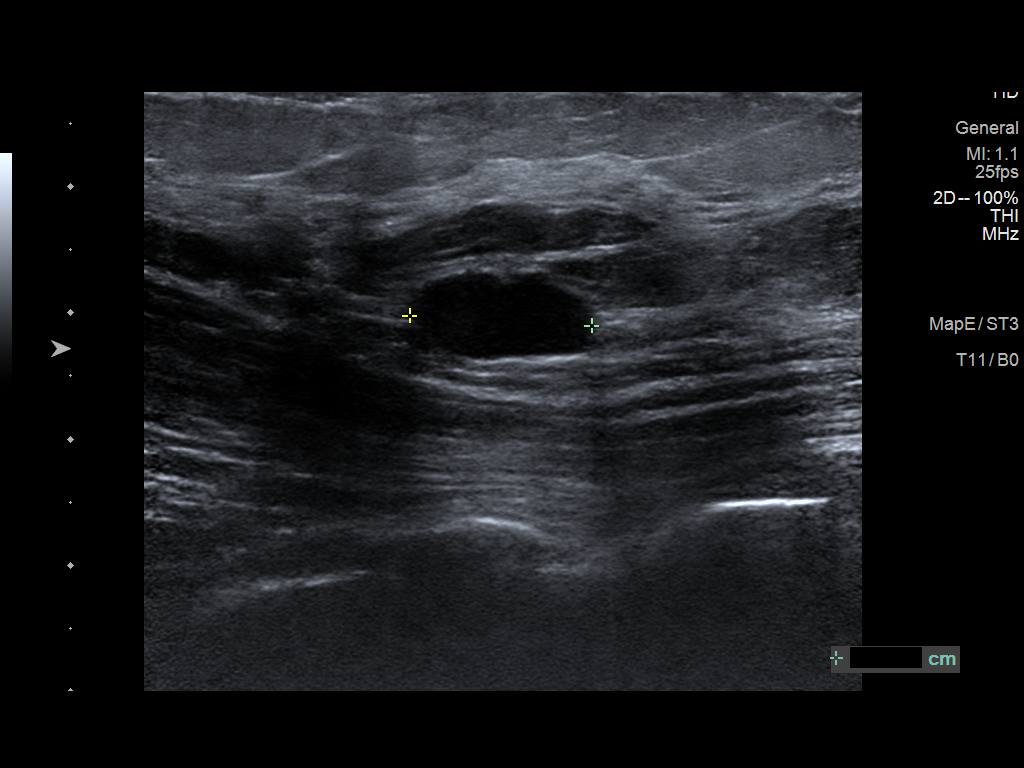
[im 7/7]
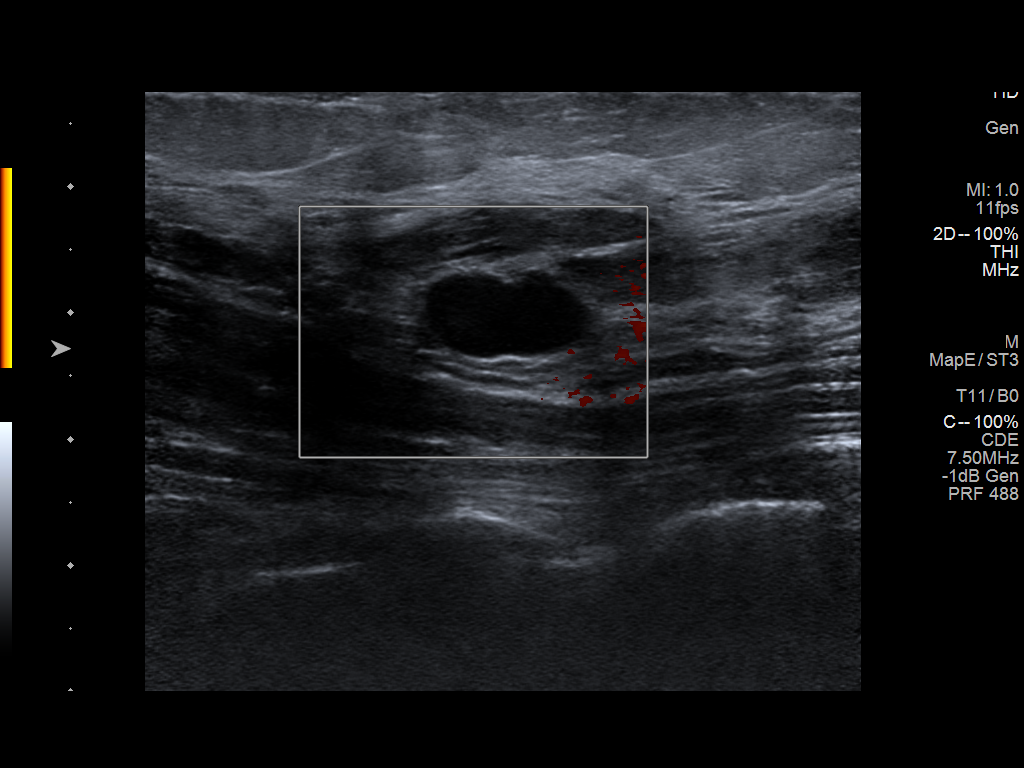

[7 of 7 positions shown; findings below may reference images not displayed]

ACR Breast Density Category b: There are scattered areas of
fibroglandular density.
FINDINGS: Additional imaging of the right breast was performed. There is
persistence of a 1.4 cm circumscribed mass in the upper aspect of
the breast.

On physical exam, I do not palpate a mass in the upper left breast.

Targeted ultrasound is performed, showing a well-circumscribed
anechoic cyst in the left breast at 12-12:30 in the 4 o'clock region
of the breast measuring 1.1 x 0.6 x 1.4 cm.
IMPRESSION: Left breast cyst.  No evidence of malignancy in the left breast.

RECOMMENDATION:
Bilateral screening mammogram in 1 year is recommended.

I have discussed the findings and recommendations with the patient.
If applicable, a reminder letter will be sent to the patient
regarding the next appointment.

BI-RADS CATEGORY  2: Benign.
